# Patient Record
Sex: Female | Born: 1980 | Race: White | Hispanic: No | Marital: Married | Smoking: Never smoker
Health system: Southern US, Community
[De-identification: ages and names within clinical notes are randomized; demographics above are authoritative.]

## PROBLEM LIST (undated history)

## (undated) DIAGNOSIS — C50919 Malignant neoplasm of unspecified site of unspecified female breast: Secondary | ICD-10-CM

## (undated) DIAGNOSIS — Z9889 Other specified postprocedural states: Secondary | ICD-10-CM

## (undated) DIAGNOSIS — Z923 Personal history of irradiation: Secondary | ICD-10-CM

## (undated) DIAGNOSIS — F419 Anxiety disorder, unspecified: Secondary | ICD-10-CM

## (undated) HISTORY — PX: TONSILLECTOMY: SUR1361

## (undated) HISTORY — DX: Malignant neoplasm of unspecified site of unspecified female breast: C50.919

## (undated) HISTORY — PX: BREAST BIOPSY: SHX20

---

## 2000-02-04 ENCOUNTER — Other Ambulatory Visit: Admission: RE | Admit: 2000-02-04 | Discharge: 2000-02-04 | Payer: Self-pay | Admitting: Otolaryngology

## 2000-02-04 ENCOUNTER — Encounter (INDEPENDENT_AMBULATORY_CARE_PROVIDER_SITE_OTHER): Payer: Self-pay | Admitting: Specialist

## 2003-06-05 ENCOUNTER — Other Ambulatory Visit: Admission: RE | Admit: 2003-06-05 | Discharge: 2003-06-05 | Payer: Self-pay | Admitting: Obstetrics and Gynecology

## 2004-01-07 ENCOUNTER — Other Ambulatory Visit: Admission: RE | Admit: 2004-01-07 | Discharge: 2004-01-07 | Payer: Self-pay | Admitting: Obstetrics and Gynecology

## 2004-06-30 ENCOUNTER — Other Ambulatory Visit: Admission: RE | Admit: 2004-06-30 | Discharge: 2004-06-30 | Payer: Self-pay | Admitting: Obstetrics and Gynecology

## 2005-03-08 ENCOUNTER — Encounter (INDEPENDENT_AMBULATORY_CARE_PROVIDER_SITE_OTHER): Payer: Self-pay | Admitting: *Deleted

## 2005-03-08 ENCOUNTER — Inpatient Hospital Stay (HOSPITAL_COMMUNITY): Admission: EM | Admit: 2005-03-08 | Discharge: 2005-03-13 | Payer: Self-pay | Admitting: Obstetrics and Gynecology

## 2005-03-14 ENCOUNTER — Encounter: Admission: RE | Admit: 2005-03-14 | Discharge: 2005-04-13 | Payer: Self-pay | Admitting: Obstetrics and Gynecology

## 2005-04-14 ENCOUNTER — Encounter: Admission: RE | Admit: 2005-04-14 | Discharge: 2005-05-03 | Payer: Self-pay | Admitting: Obstetrics and Gynecology

## 2005-07-06 ENCOUNTER — Other Ambulatory Visit: Admission: RE | Admit: 2005-07-06 | Discharge: 2005-07-06 | Payer: Self-pay | Admitting: Obstetrics and Gynecology

## 2008-12-21 ENCOUNTER — Inpatient Hospital Stay (HOSPITAL_COMMUNITY): Admission: AD | Admit: 2008-12-21 | Discharge: 2008-12-21 | Payer: Self-pay | Admitting: Obstetrics & Gynecology

## 2008-12-22 ENCOUNTER — Inpatient Hospital Stay (HOSPITAL_COMMUNITY): Admission: AD | Admit: 2008-12-22 | Discharge: 2008-12-22 | Payer: Self-pay | Admitting: Obstetrics & Gynecology

## 2008-12-23 ENCOUNTER — Inpatient Hospital Stay (HOSPITAL_COMMUNITY): Admission: AD | Admit: 2008-12-23 | Discharge: 2008-12-25 | Payer: Self-pay | Admitting: Obstetrics and Gynecology

## 2010-11-01 LAB — CBC
Hemoglobin: 10.8 g/dL — ABNORMAL LOW (ref 12.0–15.0)
MCV: 94.2 fL (ref 78.0–100.0)
Platelets: 168 10*3/uL (ref 150–400)
Platelets: 226 10*3/uL (ref 150–400)
RBC: 4.09 MIL/uL (ref 3.87–5.11)
RDW: 13 % (ref 11.5–15.5)
WBC: 12.3 10*3/uL — ABNORMAL HIGH (ref 4.0–10.5)
WBC: 18.2 10*3/uL — ABNORMAL HIGH (ref 4.0–10.5)

## 2010-11-01 LAB — RPR: RPR Ser Ql: NONREACTIVE

## 2010-11-02 LAB — CBC
HCT: 38.1 % (ref 36.0–46.0)
Hemoglobin: 13.2 g/dL (ref 12.0–15.0)
MCHC: 34.7 g/dL (ref 30.0–36.0)
RBC: 4.03 MIL/uL (ref 3.87–5.11)

## 2010-12-10 NOTE — Discharge Summary (Signed)
NAME:  LIELA, RYLEE                    ACCOUNT NO.:  [id number]   MEDICAL RECORD NO.:  [id number]          PATIENT TYPE:  INP   LOCATION:  9125                          FACILITY:  [facility]   PHYSICIAN:  Gerrit Friends. Aldona Bar, M.D.   DATE OF BIRTH:  1980-08-27   DATE OF ADMISSION:  03/08/2005  DATE OF DISCHARGE:  03/13/2005                                 DISCHARGE SUMMARY   DISCHARGE DIAGNOSIS:  1.  Term pregnancy, delivered a viable female infant 6 pounds 14 ounces,      Apgars of 08/09, arterial cord pH of 7.24.  2.  Blood type O positive.  3.  Placental abruption.   PROCEDURES:  Primary low-transverse cesarean section.   SUMMARY:  This 29 year old primigravida with a due date of March 06, 2005,  presented to maternity admissions with the onset of sudden pain and  bleeding. When evaluated she was 2 cm dilated, 80% effaced with vertex at -2  station and was felt to have possibly a small abruption ongoing as she was  contracting very frequently with very little relief of discomfort in between  contractions. She was taken to the operating room for primary low transverse  cesarean section which was carried out by Dr. Tenny Craw and was delivered 6  pounds 14 ounces female infant with Apgars of 08/09 arterial cord pH 7.24.  There was a retroplacental clot noted. Her postoperative course was benign  although hemoglobin settled at 7.1 with a white count 14,600, platelet count  of 170,000. She remained afebrile during her hospital course. She was  pumping her breasts. Unfortunately, the baby at the time of the mother's  discharge remains in the newborn intensive care unit with problems with  keeping his glucose levels up.   As mentioned, mother's hospitalization postop was uncomplicated. She had  staples removed and wound was Steri-Stripped on the morning of discharge.  She was given all of instructions at the time of discharge and understood  all instructions well.   DISCHARGE MEDICATIONS:  1.  Motrin  600 mg every 6 hours as needed for pain.  2.  She will continue on her prenatal vitamins.  3.  She will add Feosol capsules one daily.   FOLLOW UP:  She will return the office follow-up in approximately four  weeks' time or as needed.   CONDITION ON DISCHARGE:  Improved.      Gerrit Friends. Aldona Bar, M.D.  Electronically Signed     RMW/MEDQ  D:  03/13/2005  T:  03/14/2005  Job:  [id number]

## 2010-12-10 NOTE — Op Note (Signed)
NAME:  Kathryn Mckenzie, Kathryn Mckenzie                    ACCOUNT NO.:  [id number]   MEDICAL RECORD NO.:  [id number]          PATIENT TYPE:  INP   LOCATION:  9125                          FACILITY:  [facility]   PHYSICIAN:  Miguel Aschoff, M.D.       DATE OF BIRTH:  15-May-1981   DATE OF PROCEDURE:  03/08/2005  DATE OF DISCHARGE:                                 OPERATIVE REPORT   PREOPERATIVE DIAGNOSES:  1.  Intrauterine pregnancy at term.  2.  Abruptio placentae.   POSTOPERATIVE DIAGNOSES:  1.  Intrauterine pregnancy at term.  2.  Abruptio placentae.  3.  Delivery of viable female infant, Apgar 8 and 9.   PROCEDURE:  Primary low flap transverse cesarean section.   SURGEON:  Miguel Aschoff, M.D.   ANESTHESIA:  Epidural.   COMPLICATIONS:  None.   JUSTIFICATION:  The patient is a 30 year old white female at term, who  presented to the triage unit with history of sudden onset of severe pain and  vaginal bleeding.  The patient was noted to be 2 cm, was put on the monitor.  The fetal heart pattern was reactive.  Clinical examination revealed her to  be 2 cm with what appeared to be a heavy bloody vaginal discharge.  Initially it was not apparent whether this represented bloody show or  possibly a small abruption, and the patient was admitted to the labor and  delivery unit for management.  While on labor and delivery, the patient  developed very frequent contractions at one to two-minute intervals.  The  uterus was poorly relaxing, bleeding increased, and the clinical diagnosis  of abruptio placentae was made, and the patient was taken for a cesarean  section.   PROCEDURE:  The patient was taken to the operating room, placed in supine  position deviated the left, and prepped and draped in the usual sterile  fashion.  A satisfactory level of epidural anesthesia was achieved without  difficulty.  A Foley catheter had been previously placed.  A Pfannenstiel  incision was then made, extended through subcutaneous tissue  with bleeding  points being clamped and coagulated as they were encountered.  The fascia  was then identified, incised transversely.  It was then separated from the  underlying rectus muscles.  The rectus muscles were divided in the midline.  The peritoneum was then found and entered, carefully underlying structures.  The bladder flap was then created and protected with a bladder blade.  Then  an elliptical transverse incision was made into the lower uterine segment.  The amniotic cavity was entered.  Port wine colored amniotic fluid was  obtained.  The patient  was then delivered of viable female infant, Apgars 8  at one minute and 9 at five minutes, with a pH of 7.24, from a vertex  presentation with the assistance of a vacuum extractor.  The baby was handed  to the pediatric team in attendance.  Cord pH was obtained and 7.24.  Remainder of the cord bloods were then obtained and sent for routine  laboratory studies.  The placenta was  then delivered.  On inspection of the  placenta, it was obvious that there was a retroplacental clot representing  between 20 and 25% of the placental surface.  The uterus was then evacuated  of any remaining products of conception, Pitocin was administered, and then  the uterus was closed.  The angles of the uterine incision were closed using  figure-of-eight sutures of #1 Vicryl, then the uterus was closed in layers,  the first layer was a running interlocking suture of #1 Vicryl, followed by  an imbricating suture of #1 Vicryl.  The bladder flap was reapproximated  using running continuous 2-0 Vicryl suture.  At this point, lap counts and  instrument counts were found to be correct, there was excellent hemostasis,  and this point the abdomen was closed.  The parietal peritoneum was closed  using running continuous 0 Vicryl suture.  Rectus muscles were  reapproximated using running continuous 0 Vicryl suture. The fascia was  closed using two sutures of 0  Vicryl each starting at the lateral fascial  angles and meeting in the midline.  Subcutaneous tissue was closed using interrupted 0 Vicryl sutures, and the  skin incision was closed using staples.  The estimated blood loss was  approximately 700 mL.  The patient tolerated the procedure well and went to  the recovery room in satisfactory condition.  The baby was taken the nursery  in satisfactory condition.      Miguel Aschoff, M.D.  Electronically Signed     AR/MEDQ  D:  03/08/2005  T:  03/09/2005  Job:  [id number]

## 2011-02-25 ENCOUNTER — Other Ambulatory Visit: Payer: Self-pay | Admitting: Obstetrics and Gynecology

## 2013-11-20 ENCOUNTER — Other Ambulatory Visit: Payer: Self-pay | Admitting: Obstetrics and Gynecology

## 2013-11-20 DIAGNOSIS — N6452 Nipple discharge: Secondary | ICD-10-CM

## 2013-11-20 DIAGNOSIS — N6320 Unspecified lump in the left breast, unspecified quadrant: Secondary | ICD-10-CM

## 2013-11-28 ENCOUNTER — Ambulatory Visit
Admission: RE | Admit: 2013-11-28 | Discharge: 2013-11-28 | Disposition: A | Discharge status: Home / Self Care | Payer: BC Managed Care – PPO | Source: Ambulatory Visit | Attending: Obstetrics and Gynecology | Admitting: Obstetrics and Gynecology

## 2013-11-28 ENCOUNTER — Other Ambulatory Visit: Payer: Self-pay

## 2013-11-28 DIAGNOSIS — N6452 Nipple discharge: Secondary | ICD-10-CM

## 2013-11-28 DIAGNOSIS — N6320 Unspecified lump in the left breast, unspecified quadrant: Secondary | ICD-10-CM

## 2014-12-18 ENCOUNTER — Other Ambulatory Visit: Payer: Self-pay | Admitting: Obstetrics and Gynecology

## 2014-12-19 LAB — CYTOLOGY - PAP

## 2015-12-29 ENCOUNTER — Other Ambulatory Visit: Payer: Self-pay | Admitting: Obstetrics and Gynecology

## 2015-12-30 LAB — CYTOLOGY - PAP

## 2016-08-01 DIAGNOSIS — D2239 Melanocytic nevi of other parts of face: Secondary | ICD-10-CM | POA: Diagnosis not present

## 2016-08-01 DIAGNOSIS — D220 Melanocytic nevi of lip: Secondary | ICD-10-CM | POA: Diagnosis not present

## 2016-08-01 DIAGNOSIS — D2230 Melanocytic nevi of unspecified part of face: Secondary | ICD-10-CM | POA: Diagnosis not present

## 2016-08-01 DIAGNOSIS — D485 Neoplasm of uncertain behavior of skin: Secondary | ICD-10-CM | POA: Diagnosis not present

## 2016-09-20 DIAGNOSIS — H10413 Chronic giant papillary conjunctivitis, bilateral: Secondary | ICD-10-CM | POA: Diagnosis not present

## 2016-12-28 DIAGNOSIS — M9901 Segmental and somatic dysfunction of cervical region: Secondary | ICD-10-CM | POA: Diagnosis not present

## 2016-12-28 DIAGNOSIS — M9903 Segmental and somatic dysfunction of lumbar region: Secondary | ICD-10-CM | POA: Diagnosis not present

## 2016-12-28 DIAGNOSIS — M5412 Radiculopathy, cervical region: Secondary | ICD-10-CM | POA: Diagnosis not present

## 2017-01-02 DIAGNOSIS — M9901 Segmental and somatic dysfunction of cervical region: Secondary | ICD-10-CM | POA: Diagnosis not present

## 2017-01-02 DIAGNOSIS — M9903 Segmental and somatic dysfunction of lumbar region: Secondary | ICD-10-CM | POA: Diagnosis not present

## 2017-01-02 DIAGNOSIS — M5412 Radiculopathy, cervical region: Secondary | ICD-10-CM | POA: Diagnosis not present

## 2017-01-04 DIAGNOSIS — M9903 Segmental and somatic dysfunction of lumbar region: Secondary | ICD-10-CM | POA: Diagnosis not present

## 2017-01-04 DIAGNOSIS — M9901 Segmental and somatic dysfunction of cervical region: Secondary | ICD-10-CM | POA: Diagnosis not present

## 2017-01-04 DIAGNOSIS — M5412 Radiculopathy, cervical region: Secondary | ICD-10-CM | POA: Diagnosis not present

## 2017-04-11 DIAGNOSIS — D2262 Melanocytic nevi of left upper limb, including shoulder: Secondary | ICD-10-CM | POA: Diagnosis not present

## 2017-04-11 DIAGNOSIS — L718 Other rosacea: Secondary | ICD-10-CM | POA: Diagnosis not present

## 2017-04-11 DIAGNOSIS — D2271 Melanocytic nevi of right lower limb, including hip: Secondary | ICD-10-CM | POA: Diagnosis not present

## 2017-04-11 DIAGNOSIS — D485 Neoplasm of uncertain behavior of skin: Secondary | ICD-10-CM | POA: Diagnosis not present

## 2017-04-11 DIAGNOSIS — D225 Melanocytic nevi of trunk: Secondary | ICD-10-CM | POA: Diagnosis not present

## 2017-04-14 DIAGNOSIS — J329 Chronic sinusitis, unspecified: Secondary | ICD-10-CM | POA: Diagnosis not present

## 2017-06-01 DIAGNOSIS — Z01419 Encounter for gynecological examination (general) (routine) without abnormal findings: Secondary | ICD-10-CM | POA: Diagnosis not present

## 2017-06-01 DIAGNOSIS — Z124 Encounter for screening for malignant neoplasm of cervix: Secondary | ICD-10-CM | POA: Diagnosis not present

## 2017-06-02 DIAGNOSIS — Z23 Encounter for immunization: Secondary | ICD-10-CM | POA: Diagnosis not present

## 2017-06-19 DIAGNOSIS — Z0000 Encounter for general adult medical examination without abnormal findings: Secondary | ICD-10-CM | POA: Diagnosis not present

## 2017-06-23 DIAGNOSIS — R7989 Other specified abnormal findings of blood chemistry: Secondary | ICD-10-CM | POA: Diagnosis not present

## 2017-06-23 DIAGNOSIS — E039 Hypothyroidism, unspecified: Secondary | ICD-10-CM | POA: Diagnosis not present

## 2017-06-23 DIAGNOSIS — Z0000 Encounter for general adult medical examination without abnormal findings: Secondary | ICD-10-CM | POA: Diagnosis not present

## 2017-08-29 DIAGNOSIS — E039 Hypothyroidism, unspecified: Secondary | ICD-10-CM | POA: Diagnosis not present

## 2017-08-29 DIAGNOSIS — R7989 Other specified abnormal findings of blood chemistry: Secondary | ICD-10-CM | POA: Diagnosis not present

## 2017-09-04 DIAGNOSIS — E559 Vitamin D deficiency, unspecified: Secondary | ICD-10-CM | POA: Diagnosis not present

## 2017-10-04 DIAGNOSIS — H10413 Chronic giant papillary conjunctivitis, bilateral: Secondary | ICD-10-CM | POA: Diagnosis not present

## 2018-05-22 DIAGNOSIS — D2261 Melanocytic nevi of right upper limb, including shoulder: Secondary | ICD-10-CM | POA: Diagnosis not present

## 2018-05-22 DIAGNOSIS — L905 Scar conditions and fibrosis of skin: Secondary | ICD-10-CM | POA: Diagnosis not present

## 2018-05-22 DIAGNOSIS — L7211 Pilar cyst: Secondary | ICD-10-CM | POA: Diagnosis not present

## 2018-06-26 DIAGNOSIS — E039 Hypothyroidism, unspecified: Secondary | ICD-10-CM | POA: Diagnosis not present

## 2018-06-26 DIAGNOSIS — R7989 Other specified abnormal findings of blood chemistry: Secondary | ICD-10-CM | POA: Diagnosis not present

## 2018-06-26 DIAGNOSIS — Z0000 Encounter for general adult medical examination without abnormal findings: Secondary | ICD-10-CM | POA: Diagnosis not present

## 2018-07-05 DIAGNOSIS — Z0000 Encounter for general adult medical examination without abnormal findings: Secondary | ICD-10-CM | POA: Diagnosis not present

## 2018-07-05 DIAGNOSIS — Z23 Encounter for immunization: Secondary | ICD-10-CM | POA: Diagnosis not present

## 2018-08-02 DIAGNOSIS — Z124 Encounter for screening for malignant neoplasm of cervix: Secondary | ICD-10-CM | POA: Diagnosis not present

## 2018-08-08 DIAGNOSIS — H6501 Acute serous otitis media, right ear: Secondary | ICD-10-CM | POA: Diagnosis not present

## 2018-08-08 DIAGNOSIS — R05 Cough: Secondary | ICD-10-CM | POA: Diagnosis not present

## 2019-08-13 ENCOUNTER — Other Ambulatory Visit: Payer: Self-pay | Admitting: Obstetrics and Gynecology

## 2019-08-13 DIAGNOSIS — N6320 Unspecified lump in the left breast, unspecified quadrant: Secondary | ICD-10-CM

## 2019-08-26 ENCOUNTER — Other Ambulatory Visit: Payer: Self-pay

## 2019-08-27 ENCOUNTER — Ambulatory Visit
Admission: RE | Admit: 2019-08-27 | Discharge: 2019-08-27 | Disposition: A | Discharge status: Home / Self Care | Payer: Self-pay | Source: Ambulatory Visit | Attending: Obstetrics and Gynecology | Admitting: Obstetrics and Gynecology

## 2019-08-27 ENCOUNTER — Other Ambulatory Visit: Payer: Self-pay

## 2019-08-27 ENCOUNTER — Other Ambulatory Visit: Payer: Self-pay | Admitting: Obstetrics and Gynecology

## 2019-08-27 DIAGNOSIS — N6320 Unspecified lump in the left breast, unspecified quadrant: Secondary | ICD-10-CM

## 2019-09-04 ENCOUNTER — Other Ambulatory Visit: Payer: Self-pay

## 2019-09-23 ENCOUNTER — Ambulatory Visit: Payer: Self-pay | Attending: Internal Medicine

## 2019-09-23 DIAGNOSIS — Z20822 Contact with and (suspected) exposure to covid-19: Secondary | ICD-10-CM

## 2019-09-24 LAB — NOVEL CORONAVIRUS, NAA: SARS-CoV-2, NAA: NOT DETECTED

## 2020-02-25 ENCOUNTER — Other Ambulatory Visit: Payer: Self-pay

## 2020-03-11 ENCOUNTER — Other Ambulatory Visit: Payer: Self-pay | Admitting: Obstetrics and Gynecology

## 2020-03-11 ENCOUNTER — Ambulatory Visit
Admission: RE | Admit: 2020-03-11 | Discharge: 2020-03-11 | Disposition: A | Discharge status: Home / Self Care | Payer: 59 | Source: Ambulatory Visit | Attending: Obstetrics and Gynecology | Admitting: Obstetrics and Gynecology

## 2020-03-11 ENCOUNTER — Other Ambulatory Visit: Payer: Self-pay

## 2020-03-11 DIAGNOSIS — N6320 Unspecified lump in the left breast, unspecified quadrant: Secondary | ICD-10-CM

## 2020-06-24 DIAGNOSIS — F5101 Primary insomnia: Secondary | ICD-10-CM | POA: Diagnosis not present

## 2020-07-08 DIAGNOSIS — E559 Vitamin D deficiency, unspecified: Secondary | ICD-10-CM | POA: Diagnosis not present

## 2020-07-08 DIAGNOSIS — E039 Hypothyroidism, unspecified: Secondary | ICD-10-CM | POA: Diagnosis not present

## 2020-07-08 DIAGNOSIS — R7989 Other specified abnormal findings of blood chemistry: Secondary | ICD-10-CM | POA: Diagnosis not present

## 2020-07-08 DIAGNOSIS — Z13220 Encounter for screening for lipoid disorders: Secondary | ICD-10-CM | POA: Diagnosis not present

## 2020-07-08 DIAGNOSIS — Z0000 Encounter for general adult medical examination without abnormal findings: Secondary | ICD-10-CM | POA: Diagnosis not present

## 2020-07-13 DIAGNOSIS — Z0000 Encounter for general adult medical examination without abnormal findings: Secondary | ICD-10-CM | POA: Diagnosis not present

## 2020-07-13 DIAGNOSIS — R7989 Other specified abnormal findings of blood chemistry: Secondary | ICD-10-CM | POA: Diagnosis not present

## 2020-08-26 DIAGNOSIS — D2261 Melanocytic nevi of right upper limb, including shoulder: Secondary | ICD-10-CM | POA: Diagnosis not present

## 2020-08-26 DIAGNOSIS — L821 Other seborrheic keratosis: Secondary | ICD-10-CM | POA: Diagnosis not present

## 2020-08-26 DIAGNOSIS — L7211 Pilar cyst: Secondary | ICD-10-CM | POA: Diagnosis not present

## 2020-08-26 DIAGNOSIS — D2262 Melanocytic nevi of left upper limb, including shoulder: Secondary | ICD-10-CM | POA: Diagnosis not present

## 2020-08-28 DIAGNOSIS — Z6820 Body mass index (BMI) 20.0-20.9, adult: Secondary | ICD-10-CM | POA: Diagnosis not present

## 2020-08-28 DIAGNOSIS — Z01419 Encounter for gynecological examination (general) (routine) without abnormal findings: Secondary | ICD-10-CM | POA: Diagnosis not present

## 2020-09-14 ENCOUNTER — Other Ambulatory Visit: Payer: Self-pay

## 2020-09-14 ENCOUNTER — Ambulatory Visit
Admission: RE | Admit: 2020-09-14 | Discharge: 2020-09-14 | Disposition: A | Discharge status: Home / Self Care | Payer: 59 | Source: Ambulatory Visit | Attending: Obstetrics and Gynecology | Admitting: Obstetrics and Gynecology

## 2020-09-14 DIAGNOSIS — N6320 Unspecified lump in the left breast, unspecified quadrant: Secondary | ICD-10-CM

## 2020-09-14 DIAGNOSIS — N6002 Solitary cyst of left breast: Secondary | ICD-10-CM | POA: Diagnosis not present

## 2020-10-14 DIAGNOSIS — Z20822 Contact with and (suspected) exposure to covid-19: Secondary | ICD-10-CM | POA: Diagnosis not present

## 2020-10-16 DIAGNOSIS — M9901 Segmental and somatic dysfunction of cervical region: Secondary | ICD-10-CM | POA: Diagnosis not present

## 2020-10-16 DIAGNOSIS — M9903 Segmental and somatic dysfunction of lumbar region: Secondary | ICD-10-CM | POA: Diagnosis not present

## 2020-10-16 DIAGNOSIS — M9905 Segmental and somatic dysfunction of pelvic region: Secondary | ICD-10-CM | POA: Diagnosis not present

## 2020-10-16 DIAGNOSIS — M9902 Segmental and somatic dysfunction of thoracic region: Secondary | ICD-10-CM | POA: Diagnosis not present

## 2020-10-22 DIAGNOSIS — M9903 Segmental and somatic dysfunction of lumbar region: Secondary | ICD-10-CM | POA: Diagnosis not present

## 2020-10-22 DIAGNOSIS — M9902 Segmental and somatic dysfunction of thoracic region: Secondary | ICD-10-CM | POA: Diagnosis not present

## 2020-10-22 DIAGNOSIS — M9901 Segmental and somatic dysfunction of cervical region: Secondary | ICD-10-CM | POA: Diagnosis not present

## 2020-10-22 DIAGNOSIS — M9905 Segmental and somatic dysfunction of pelvic region: Secondary | ICD-10-CM | POA: Diagnosis not present

## 2020-10-30 DIAGNOSIS — M9905 Segmental and somatic dysfunction of pelvic region: Secondary | ICD-10-CM | POA: Diagnosis not present

## 2020-10-30 DIAGNOSIS — M9903 Segmental and somatic dysfunction of lumbar region: Secondary | ICD-10-CM | POA: Diagnosis not present

## 2020-10-30 DIAGNOSIS — M9902 Segmental and somatic dysfunction of thoracic region: Secondary | ICD-10-CM | POA: Diagnosis not present

## 2020-10-30 DIAGNOSIS — M9901 Segmental and somatic dysfunction of cervical region: Secondary | ICD-10-CM | POA: Diagnosis not present

## 2020-11-16 DIAGNOSIS — M9903 Segmental and somatic dysfunction of lumbar region: Secondary | ICD-10-CM | POA: Diagnosis not present

## 2020-11-16 DIAGNOSIS — M9905 Segmental and somatic dysfunction of pelvic region: Secondary | ICD-10-CM | POA: Diagnosis not present

## 2020-11-16 DIAGNOSIS — M9902 Segmental and somatic dysfunction of thoracic region: Secondary | ICD-10-CM | POA: Diagnosis not present

## 2020-11-16 DIAGNOSIS — M9901 Segmental and somatic dysfunction of cervical region: Secondary | ICD-10-CM | POA: Diagnosis not present

## 2020-11-19 DIAGNOSIS — H52203 Unspecified astigmatism, bilateral: Secondary | ICD-10-CM | POA: Diagnosis not present

## 2020-11-19 DIAGNOSIS — H10413 Chronic giant papillary conjunctivitis, bilateral: Secondary | ICD-10-CM | POA: Diagnosis not present

## 2020-11-19 DIAGNOSIS — H5213 Myopia, bilateral: Secondary | ICD-10-CM | POA: Diagnosis not present

## 2020-11-23 DIAGNOSIS — M9902 Segmental and somatic dysfunction of thoracic region: Secondary | ICD-10-CM | POA: Diagnosis not present

## 2020-11-23 DIAGNOSIS — M9905 Segmental and somatic dysfunction of pelvic region: Secondary | ICD-10-CM | POA: Diagnosis not present

## 2020-11-23 DIAGNOSIS — M9901 Segmental and somatic dysfunction of cervical region: Secondary | ICD-10-CM | POA: Diagnosis not present

## 2020-11-23 DIAGNOSIS — M9903 Segmental and somatic dysfunction of lumbar region: Secondary | ICD-10-CM | POA: Diagnosis not present

## 2020-12-07 DIAGNOSIS — M9902 Segmental and somatic dysfunction of thoracic region: Secondary | ICD-10-CM | POA: Diagnosis not present

## 2020-12-07 DIAGNOSIS — M9905 Segmental and somatic dysfunction of pelvic region: Secondary | ICD-10-CM | POA: Diagnosis not present

## 2020-12-07 DIAGNOSIS — M9901 Segmental and somatic dysfunction of cervical region: Secondary | ICD-10-CM | POA: Diagnosis not present

## 2020-12-07 DIAGNOSIS — M9903 Segmental and somatic dysfunction of lumbar region: Secondary | ICD-10-CM | POA: Diagnosis not present

## 2020-12-31 DIAGNOSIS — M9901 Segmental and somatic dysfunction of cervical region: Secondary | ICD-10-CM | POA: Diagnosis not present

## 2020-12-31 DIAGNOSIS — M9902 Segmental and somatic dysfunction of thoracic region: Secondary | ICD-10-CM | POA: Diagnosis not present

## 2020-12-31 DIAGNOSIS — M9903 Segmental and somatic dysfunction of lumbar region: Secondary | ICD-10-CM | POA: Diagnosis not present

## 2020-12-31 DIAGNOSIS — M9905 Segmental and somatic dysfunction of pelvic region: Secondary | ICD-10-CM | POA: Diagnosis not present

## 2021-01-28 DIAGNOSIS — M9903 Segmental and somatic dysfunction of lumbar region: Secondary | ICD-10-CM | POA: Diagnosis not present

## 2021-01-28 DIAGNOSIS — M9905 Segmental and somatic dysfunction of pelvic region: Secondary | ICD-10-CM | POA: Diagnosis not present

## 2021-01-28 DIAGNOSIS — M9902 Segmental and somatic dysfunction of thoracic region: Secondary | ICD-10-CM | POA: Diagnosis not present

## 2021-01-28 DIAGNOSIS — M9901 Segmental and somatic dysfunction of cervical region: Secondary | ICD-10-CM | POA: Diagnosis not present

## 2021-03-01 DIAGNOSIS — M9901 Segmental and somatic dysfunction of cervical region: Secondary | ICD-10-CM | POA: Diagnosis not present

## 2021-03-01 DIAGNOSIS — M9905 Segmental and somatic dysfunction of pelvic region: Secondary | ICD-10-CM | POA: Diagnosis not present

## 2021-03-01 DIAGNOSIS — M9902 Segmental and somatic dysfunction of thoracic region: Secondary | ICD-10-CM | POA: Diagnosis not present

## 2021-03-01 DIAGNOSIS — M9903 Segmental and somatic dysfunction of lumbar region: Secondary | ICD-10-CM | POA: Diagnosis not present

## 2021-04-15 DIAGNOSIS — M9905 Segmental and somatic dysfunction of pelvic region: Secondary | ICD-10-CM | POA: Diagnosis not present

## 2021-04-15 DIAGNOSIS — M9903 Segmental and somatic dysfunction of lumbar region: Secondary | ICD-10-CM | POA: Diagnosis not present

## 2021-04-15 DIAGNOSIS — M9901 Segmental and somatic dysfunction of cervical region: Secondary | ICD-10-CM | POA: Diagnosis not present

## 2021-04-15 DIAGNOSIS — M9902 Segmental and somatic dysfunction of thoracic region: Secondary | ICD-10-CM | POA: Diagnosis not present

## 2021-07-25 HISTORY — PX: BREAST LUMPECTOMY: SHX2

## 2021-08-11 ENCOUNTER — Other Ambulatory Visit: Payer: Self-pay | Admitting: Registered Nurse

## 2021-08-11 DIAGNOSIS — N6320 Unspecified lump in the left breast, unspecified quadrant: Secondary | ICD-10-CM

## 2021-08-11 DIAGNOSIS — N6310 Unspecified lump in the right breast, unspecified quadrant: Secondary | ICD-10-CM

## 2021-08-30 ENCOUNTER — Ambulatory Visit
Admission: RE | Admit: 2021-08-30 | Discharge: 2021-08-30 | Disposition: A | Discharge status: Home / Self Care | Payer: BC Managed Care – PPO | Source: Ambulatory Visit | Attending: Registered Nurse | Admitting: Registered Nurse

## 2021-08-30 ENCOUNTER — Other Ambulatory Visit: Payer: Self-pay | Admitting: Registered Nurse

## 2021-08-30 ENCOUNTER — Ambulatory Visit
Admission: RE | Admit: 2021-08-30 | Discharge: 2021-08-30 | Disposition: A | Discharge status: Home / Self Care | Payer: No Typology Code available for payment source | Source: Ambulatory Visit | Attending: Registered Nurse | Admitting: Registered Nurse

## 2021-08-30 ENCOUNTER — Other Ambulatory Visit: Payer: Self-pay

## 2021-08-30 DIAGNOSIS — N6310 Unspecified lump in the right breast, unspecified quadrant: Secondary | ICD-10-CM

## 2021-08-30 DIAGNOSIS — N6320 Unspecified lump in the left breast, unspecified quadrant: Secondary | ICD-10-CM

## 2021-09-02 ENCOUNTER — Ambulatory Visit
Admission: RE | Admit: 2021-09-02 | Discharge: 2021-09-02 | Disposition: A | Discharge status: Home / Self Care | Payer: No Typology Code available for payment source | Source: Ambulatory Visit | Attending: Registered Nurse | Admitting: Registered Nurse

## 2021-09-02 DIAGNOSIS — N6320 Unspecified lump in the left breast, unspecified quadrant: Secondary | ICD-10-CM

## 2021-09-02 DIAGNOSIS — N6310 Unspecified lump in the right breast, unspecified quadrant: Secondary | ICD-10-CM

## 2021-09-03 ENCOUNTER — Telehealth: Payer: Self-pay | Admitting: Hematology and Oncology

## 2021-09-03 NOTE — Telephone Encounter (Signed)
Spoke to patient to confirm afternoon clinic appointment for 2/15, paperwork will be sent via e-mail

## 2021-09-06 ENCOUNTER — Encounter: Payer: Self-pay | Admitting: *Deleted

## 2021-09-07 ENCOUNTER — Other Ambulatory Visit: Payer: Self-pay | Admitting: *Deleted

## 2021-09-07 DIAGNOSIS — C50411 Malignant neoplasm of upper-outer quadrant of right female breast: Secondary | ICD-10-CM | POA: Insufficient documentation

## 2021-09-07 DIAGNOSIS — Z170 Estrogen receptor positive status [ER+]: Secondary | ICD-10-CM | POA: Insufficient documentation

## 2021-09-07 NOTE — Progress Notes (Signed)
[facility] CONSULT NOTE  Patient Care Team: Holland Commons, FNP as PCP - General (Internal Medicine) Jovita Kussmaul, MD as Consulting Physician (General Surgery) Nicholas Lose, MD as Consulting Physician (Hematology and Oncology) Eppie Gibson, MD as Attending Physician (Radiation Oncology)  CHIEF COMPLAINTS/PURPOSE OF CONSULTATION:  Newly diagnosed right breast cancer  HISTORY OF PRESENTING ILLNESS:  Kathryn Mckenzie 41 y.o. 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 She presented with a palpable mass in the right breast. Diagnostic mammogram and [place] on 08/30/2021 showed suspicious right breast mass at the 10 o'clock position 8 cm from the nipple. Biopsy on 09/02/2021 showed invasive ductal carcinoma and DCIS, ER/PR+/Her2-. She presents to the clinic today for initial evaluation and discussion of treatment options.   I reviewed her records extensively and collaborated the history with the patient.  SUMMARY OF ONCOLOGIC HISTORY: Oncology History  Malignant neoplasm of upper-outer quadrant of right breast in female, estrogen receptor positive ([place])  09/02/2021 Initial Diagnosis   Palpable right breast lump for 2 to 3 months UOQ.  Mammogram and ultrasound: 10:00: 1.2 cm mass, axilla negative, biopsy: Grade 1 IDC with DCIS (inside a fibroadenoma) ER 90%, PR 100%, HER2 negative, Ki-67 1%     MEDICAL HISTORY:  Past Medical History:  Diagnosis Date   Breast cancer ([place])     SURGICAL HISTORY: Past Surgical History:  Procedure Laterality Date   CESAREAN SECTION     TONSILLECTOMY      SOCIAL HISTORY: Social History   Socioeconomic History   Marital status: Married    Spouse name: Not on file   Number of children: Not on file   Years of education: Not on file   Highest education level: Not on file  Occupational History   Not on file  Tobacco Use   Smoking status: Never   Smokeless tobacco: Never   Substance and Sexual Activity   Alcohol use: Yes   Drug use: Never   Sexual activity: Not on file  Other Topics Concern   Not on file  Social History Narrative   Not on file   Social Determinants of Health   Financial Resource Strain: Not on file  Food Insecurity: Not on file  Transportation Needs: Not on file  Physical Activity: Not on file  Stress: Not on file  Social Connections: Not on file  Intimate Partner Violence: Not on file    FAMILY HISTORY: Family History  Problem Relation Age of Onset   Breast cancer Maternal Grandmother 80   Pancreatic cancer Paternal Grandmother     ALLERGIES:  has no allergies on file.  MEDICATIONS:  No current outpatient medications on file.   No current facility-administered medications for this visit.    REVIEW OF SYSTEMS:   Constitutional: Denies fevers, chills or abnormal night sweats  All other systems were reviewed with the patient and are negative.  PHYSICAL EXAMINATION: ECOG PERFORMANCE STATUS: 1 - Symptomatic but completely ambulatory  Vitals:   09/08/21 1241  BP: 123/73  Pulse: 71  Resp: 18  Temp: 98.1 F (36.7 C)  SpO2: 100%   Filed Weights   09/08/21 1241  Weight: 125 lb 1.6 oz (56.7 kg)      LABORATORY DATA:  I have reviewed the data as listed Lab Results  Component Value Date   WBC 5.9 09/08/2021   HGB 12.9 09/08/2021   HCT 38.3 09/08/2021   MCV 95.8 09/08/2021   PLT  239 09/08/2021   Lab Results  Component Value Date   NA 139 09/08/2021   K 4.6 09/08/2021   CL 105 09/08/2021   CO2 28 09/08/2021    RADIOGRAPHIC STUDIES: I have personally reviewed the radiological reports and agreed with the findings in the report.  ASSESSMENT AND PLAN:  Malignant neoplasm of upper-outer quadrant of right breast in female, estrogen receptor positive ([place]) 09/02/2021:Palpable right breast lump for 2 to 3 months UOQ.  Mammogram and ultrasound: 10:00: 1.2 cm mass, axilla negative, biopsy: Grade 1 IDC with DCIS  (inside a fibroadenoma) ER 90%, PR 100%, HER2 negative, Ki-67 1%  Pathology and radiology counseling:Discussed with the patient, the details of pathology including the type of breast cancer,the clinical staging, the significance of ER, PR and HER-2/neu receptors and the implications for treatment. After reviewing the pathology in detail, we proceeded to discuss the different treatment options between surgery, radiation, chemotherapy, antiestrogen therapies.  Recommendations: Breast MRI to be done because of high breast density 1. Breast conserving surgery followed by 2. Oncotype DX testing to determine if chemotherapy would be of any benefit followed by 3. Adjuvant radiation therapy followed by 4. Adjuvant antiestrogen therapy 5.  Genetic counseling  Oncotype counseling: I discussed Oncotype DX test. I explained to the patient that this is a 21 gene panel to evaluate patient tumors DNA to calculate recurrence score. This would help determine whether patient has high risk or low risk breast cancer. She understands that if her tumor was found to be high risk, she would benefit from systemic chemotherapy. If low risk, no need of chemotherapy.  She was agreeable to participate in the exact sciences blood draw study Return to clinic after surgery to discuss final pathology report and then determine if Oncotype DX testing will need to be sent.    All questions were answered. The patient knows to call the clinic with any problems, questions or concerns.   Rulon Eisenmenger, MD, MPH 09/08/2021    I, Thana Ates, am acting as scribe for Nicholas Lose, MD.  I have reviewed the above documentation for accuracy and completeness, and I agree with the above.

## 2021-09-07 NOTE — Progress Notes (Signed)
Radiation Oncology         [phone] ________________________________  Initial Outpatient Consultation  Name: Kathryn Mckenzie MRN: [id number]  Date: 09/08/2021  DOB: 24-Nov-1980  VE:HMCNOBS, Leonia Reader, FNP  Jovita Kussmaul, MD   REFERRING PHYSICIAN: Autumn Messing III, MD  DIAGNOSIS:    ICD-10-CM   1. 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 female who presented with a 2-3 month history of palpable right and left breast lumps. She has always had dense tissue that is textured in her breasts, but a right UOQ lump felt new. There were shooting pains in this area, too.  Subsequently, the Kathryn Mckenzie underwent a bilateral diagnostic mammogram and ultrasound on the date of 08/30/21 which revealed a auspicious right breast mass at the 10 o'clock position, 8 cmfn. No suspicious right axillary lymphadenopathy or evidence of malignancy in the left breast were appreciated.   Right breast biopsy at the 10 o'clock position on the date of 09/02/21 showed grade 1 invasive ductal carcinoma measuring 1.0 cm in the greatest linear extent, and ductal carcinoma in-situ.  ER status 90% positive with moderate staining intensity; PR status 100% positive with strong staining intensity; Proliferation marker Ki67 at 1%; Her2 status negative. No lymph nodes were examined.    MRI of breasts is pending. Genetic counseling is pending.  She is here with her spouse and father. She is an Futures trader.  PREVIOUS RADIATION THERAPY: No  PAST MEDICAL HISTORY:  has a past medical history of Breast cancer ([place]).    PAST SURGICAL HISTORY: Past Surgical History:  Procedure Laterality Date   CESAREAN SECTION     TONSILLECTOMY      FAMILY HISTORY: family history includes Breast cancer (age of onset: 74) in her maternal grandmother; Melanoma in her maternal aunt and maternal uncle; Pancreatic cancer (age of onset: 83) in her paternal grandmother.  SOCIAL HISTORY:  reports that she has never smoked. She has never used smokeless tobacco. She reports current alcohol use. She reports that she does not use drugs.  ALLERGIES: Kathryn Mckenzie has no known allergies.  MEDICATIONS:  Current Outpatient Medications  Medication Sig Dispense Refill   sertraline (ZOLOFT) 25 MG tablet Take by mouth.     No current facility-administered medications for this encounter.    REVIEW OF SYSTEMS: As above in HPI.   PHYSICAL EXAM:  vitals were not taken for this visit.   General: Alert and oriented, in no acute distress HEENT: Head is normocephalic. Extraocular movements are intact.  Heart: Regular in rate and rhythm with no murmurs, rubs, or gallops. Chest: Clear to auscultation bilaterally, with no rhonchi, wheezes, or rales. Abdomen: Soft, nontender, nondistended, with no rigidity or guarding. Extremities: No cyanosis or edema. Lymphatics: see Neck Exam Skin: No concerning lesions. Musculoskeletal: symmetric strength and muscle tone throughout. Neurologic: Cranial nerves II through  XII are grossly intact. No obvious focalities. Speech is fluent. Coordination is intact. Psychiatric: Judgment and insight are intact. Affect is appropriate. Breasts: dense, heterogeneous breast tissue b/l. Right breast UOQ mass at bx site is about 2cm . No obvious axillary adenopathy b/l   ECOG =  0  0 - Asymptomatic (Fully active, able to carry on all predisease activities without restriction)  1 - Symptomatic but completely ambulatory (Restricted in physically strenuous activity but ambulatory and able to carry out work of a light or sedentary nature. For example, light housework, office work)  2 - Symptomatic, <50% in bed during the day (Ambulatory and capable of all self care but unable to carry out any work activities. Up and about more than 50% of waking hours)  3 - Symptomatic, >50% in bed, but not bedbound (Capable of only limited self-care, confined to bed or chair 50% or more of waking hours)  4 - Bedbound (Completely disabled. Cannot carry on any self-care. Totally confined to bed or chair)  5 - Death   Eustace Pen MM, Creech RH, Tormey DC, et al. [phone]). "Toxicity and response criteria of the [facility]". [address] Oncol. 5 (6): 649-55   LABORATORY DATA:  Lab Results  Component Value Date   WBC 5.9 09/08/2021   HGB 12.9 09/08/2021   HCT 38.3 09/08/2021   MCV 95.8 09/08/2021   PLT 239 09/08/2021   CMP     Component Value Date/Time   NA 139 09/08/2021 1225   K 4.6 09/08/2021 1225   CL 105 09/08/2021 1225   CO2 28 09/08/2021 1225   GLUCOSE 99 09/08/2021 1225   BUN 15 09/08/2021 1225   CREATININE 0.97 09/08/2021 1225   CALCIUM 9.6 09/08/2021 1225   PROT 7.0 09/08/2021 1225   ALBUMIN 4.5 09/08/2021 1225   AST 18 09/08/2021 1225   ALT 15 09/08/2021 1225   ALKPHOS 39 09/08/2021 1225   BILITOT 0.4 09/08/2021 1225   GFRNONAA >60 09/08/2021 1225         RADIOGRAPHY: US BREAST LTD UNI LEFT INC AXILLA  Result Date: 08/30/2021 CLINICAL DATA:  41 year old female with a palpable right breast lump for 2-3 months in a physician palpated left breast lump. EXAM: DIGITAL DIAGNOSTIC BILATERAL MAMMOGRAM WITH TOMOSYNTHESIS AND CAD; ULTRASOUND LEFT BREAST LIMITED; ULTRASOUND RIGHT BREAST LIMITED TECHNIQUE: Bilateral digital diagnostic mammography and  breast tomosynthesis was performed. The images were evaluated with computer-aided detection.; Targeted ultrasound examination of the left breast was performed.; Targeted ultrasound examination of the right breast was performed COMPARISON:  Previous exam(s). ACR Breast Density Category d: The breast tissue is extremely dense, which lowers the sensitivity of mammography. FINDINGS: A radiopaque BB was placed at the site of the Kathryn Mckenzie's palpable right breast lump in the upper outer quadrant posteriorly. Focal distortion is seen just deep to the radiopaque BB. An additional BB was placed in the upper-outer quadrant of the left breast, with no focal or underlying mammographic findings. No additional suspicious mammographic findings in the remainder of either breast. Targeted ultrasound is performed, showing an irregular, hypoechoic mass with shadowing and associated vascularity at the 10 o'clock position 8 cm from the nipple on the right. It measures 1.1 x 1.2 x 0.8 cm. This corresponds with the Kathryn Mckenzie's palpable lump in focal distortion. Evaluation of the right axilla demonstrates no suspicious lymphadenopathy. Ultrasound evaluation of the upper outer left breast demonstrates no focal findings at the site of the Kathryn Mckenzie's palpable lump. A circumscribed, anechoic cyst measuring up  to 1.3 cm is incidentally noted at the 1 o'clock position 5 cm from the nipple. IMPRESSION: 1. Suspicious right breast mass at the 10 o'clock position 8 cm from the nipple. Recommendation is for ultrasound-guided biopsy. 2. No suspicious right axillary lymphadenopathy. 3. No mammographic or sonographic evidence of malignancy on the left. RECOMMENDATION: 1. Ultrasound-guided biopsy of the right breast. 2. Pending biopsy results, further evaluation with contrast enhanced breast MRI is recommended given the Kathryn Mckenzie's extreme breast density. I have discussed the findings and recommendations with the Kathryn Mckenzie. If applicable, a reminder letter will  be sent to the Kathryn Mckenzie regarding the next appointment. BI-RADS CATEGORY  4: Suspicious. Electronically Signed   By: Kristopher Oppenheim M.D.   On: 08/30/2021 10:46  US BREAST LTD UNI RIGHT INC AXILLA  Result Date: 08/30/2021 CLINICAL DATA:  41 year old female with a palpable right breast lump for 2-3 months in a physician palpated left breast lump. EXAM: DIGITAL DIAGNOSTIC BILATERAL MAMMOGRAM WITH TOMOSYNTHESIS AND CAD; ULTRASOUND LEFT BREAST LIMITED; ULTRASOUND RIGHT BREAST LIMITED TECHNIQUE: Bilateral digital diagnostic mammography and breast tomosynthesis was performed. The images were evaluated with computer-aided detection.; Targeted ultrasound examination of the left breast was performed.; Targeted ultrasound examination of the right breast was performed COMPARISON:  Previous exam(s). ACR Breast Density Category d: The breast tissue is extremely dense, which lowers the sensitivity of mammography. FINDINGS: A radiopaque BB was placed at the site of the Kathryn Mckenzie's palpable right breast lump in the upper outer quadrant posteriorly. Focal distortion is seen just deep to the radiopaque BB. An additional BB was placed in the upper-outer quadrant of the left breast, with no focal or underlying mammographic findings. No additional suspicious mammographic findings in the remainder of either breast. Targeted ultrasound is performed, showing an irregular, hypoechoic mass with shadowing and associated vascularity at the 10 o'clock position 8 cm from the nipple on the right. It measures 1.1 x 1.2 x 0.8 cm. This corresponds with the Kathryn Mckenzie's palpable lump in focal distortion. Evaluation of the right axilla demonstrates no suspicious lymphadenopathy. Ultrasound evaluation of the upper outer left breast demonstrates no focal findings at the site of the Kathryn Mckenzie's palpable lump. A circumscribed, anechoic cyst measuring up to 1.3 cm is incidentally noted at the 1 o'clock position 5 cm from the nipple. IMPRESSION: 1. Suspicious  right breast mass at the 10 o'clock position 8 cm from the nipple. Recommendation is for ultrasound-guided biopsy. 2. No suspicious right axillary lymphadenopathy. 3. No mammographic or sonographic evidence of malignancy on the left. RECOMMENDATION: 1. Ultrasound-guided biopsy of the right breast. 2. Pending biopsy results, further evaluation with contrast enhanced breast MRI is recommended given the Kathryn Mckenzie's extreme breast density. I have discussed the findings and recommendations with the Kathryn Mckenzie. If applicable, a reminder letter will be sent to the Kathryn Mckenzie regarding the next appointment. BI-RADS CATEGORY  4: Suspicious. Electronically Signed   By: Kristopher Oppenheim M.D.   On: 08/30/2021 10:46  MM DIAG BREAST TOMO BILATERAL  Result Date: 08/30/2021 CLINICAL DATA:  41 year old female with a palpable right breast lump for 2-3 months in a physician palpated left breast lump. EXAM: DIGITAL DIAGNOSTIC BILATERAL MAMMOGRAM WITH TOMOSYNTHESIS AND CAD; ULTRASOUND LEFT BREAST LIMITED; ULTRASOUND RIGHT BREAST LIMITED TECHNIQUE: Bilateral digital diagnostic mammography and breast tomosynthesis was performed. The images were evaluated with computer-aided detection.; Targeted ultrasound examination of the left breast was performed.; Targeted ultrasound examination of the right breast was performed COMPARISON:  Previous exam(s). ACR Breast Density Category d: The breast tissue is extremely dense, which  lowers the sensitivity of mammography. FINDINGS: A radiopaque BB was placed at the site of the Kathryn Mckenzie's palpable right breast lump in the upper outer quadrant posteriorly. Focal distortion is seen just deep to the radiopaque BB. An additional BB was placed in the upper-outer quadrant of the left breast, with no focal or underlying mammographic findings. No additional suspicious mammographic findings in the remainder of either breast. Targeted ultrasound is performed, showing an irregular, hypoechoic mass with shadowing and  associated vascularity at the 10 o'clock position 8 cm from the nipple on the right. It measures 1.1 x 1.2 x 0.8 cm. This corresponds with the Kathryn Mckenzie's palpable lump in focal distortion. Evaluation of the right axilla demonstrates no suspicious lymphadenopathy. Ultrasound evaluation of the upper outer left breast demonstrates no focal findings at the site of the Kathryn Mckenzie's palpable lump. A circumscribed, anechoic cyst measuring up to 1.3 cm is incidentally noted at the 1 o'clock position 5 cm from the nipple. IMPRESSION: 1. Suspicious right breast mass at the 10 o'clock position 8 cm from the nipple. Recommendation is for ultrasound-guided biopsy. 2. No suspicious right axillary lymphadenopathy. 3. No mammographic or sonographic evidence of malignancy on the left. RECOMMENDATION: 1. Ultrasound-guided biopsy of the right breast. 2. Pending biopsy results, further evaluation with contrast enhanced breast MRI is recommended given the Kathryn Mckenzie's extreme breast density. I have discussed the findings and recommendations with the Kathryn Mckenzie. If applicable, a reminder letter will be sent to the Kathryn Mckenzie regarding the next appointment. BI-RADS CATEGORY  4: Suspicious. Electronically Signed   By: Kristopher Oppenheim M.D.   On: 08/30/2021 10:46  MM CLIP PLACEMENT RIGHT  Result Date: 09/02/2021 CLINICAL DATA:  Evaluate biopsy marker EXAM: 3D DIAGNOSTIC RIGHT MAMMOGRAM POST ULTRASOUND BIOPSY COMPARISON:  Previous exam(s). FINDINGS: 3D Mammographic images were obtained following ultrasound guided biopsy of a right breast mass. Biopsy clip is along the inferior medial aspect of the breast mass located approximately 5.8 mm medial and 5 mm inferior to the center of the mass. IMPRESSION: The biopsy clip is in the inferior medial aspect of the biopsied right breast mass. The ribbon shaped clip is 5.8 mm medial and 5 mm inferior to the center of the mass. Final Assessment: Post Procedure Mammograms for Marker Placement Electronically Signed    By: Dorise Bullion III M.D.   On: 09/02/2021 09:00  [place] RT BREAST BX W LOC DEV 1ST LESION IMG BX SPEC US GUIDE  Addendum Date: 09/08/2021   ADDENDUM REPORT: 09/08/2021 19:45 ADDENDUM: Pathology revealed GRADE I INVASIVE DUCTAL CARCINOMA, DUCTAL CARCINOMA IN SITU of the RIGHT breast, 10 o'clock, (ribbon clip). The carcinoma appears to be arising within a fibroadenoma. This was found to be concordant by Dr. Dorise Bullion. Pathology results were discussed with the Kathryn Mckenzie by telephone. The Kathryn Mckenzie reported doing well after the biopsy with tenderness at the site. Post biopsy instructions and care were reviewed and questions were answered. The Kathryn Mckenzie was encouraged to call [facility] for any additional concerns. My direct phone number was provided. The Kathryn Mckenzie was referred to [facility] on September 08, 2021. Further evaluation with contrast enhanced breast MRI is recommended to exclude any additional sites of disease given the Kathryn Mckenzie's extreme breast density and premenopausal status. Pathology results reported by Terie Purser, RN on 09/06/2021. Electronically Signed   By: Dorise Bullion III M.D.   On: 09/08/2021 19:45   Result Date: 09/08/2021 CLINICAL DATA:  Biopsy of  a 10 o'clock right breast mass EXAM: ULTRASOUND GUIDED RIGHT BREAST CORE NEEDLE BIOPSY COMPARISON:  Previous exam(s). PROCEDURE: I met with the Kathryn Mckenzie and we discussed the procedure of ultrasound-guided biopsy, including benefits and alternatives. We discussed the high likelihood of a successful procedure. We discussed the risks of the procedure, including infection, bleeding, tissue injury, clip migration, and inadequate sampling. Informed written consent was given. The usual time-out protocol was performed immediately prior to the procedure. Lesion quadrant: 10 o'clock right breast Using sterile technique and 1% Lidocaine as local  anesthetic, under direct ultrasound visualization, a 12 gauge spring-loaded device was used to perform biopsy of a 10 o'clock right breast mass using a lateral approach. At the conclusion of the procedure a ribbon shaped tissue marker clip was deployed into the biopsy cavity. Follow up 2 view mammogram was performed and dictated separately. IMPRESSION: Ultrasound guided biopsy of a 10 o'clock right breast mass. No apparent complications. Electronically Signed: By: Dorise Bullion III M.D. On: 09/02/2021 08:33     IMPRESSION/PLAN: This is a delightful 41 yo woman with stage I ER+  right breast cancer.  She has been discussed at our multidisciplinary tumor board.  The consensus is that she may be a good candidate for breast conservation pending further breast imaging (MRI). I talked to her about the option of a mastectomy and informed her that her expected overall survival would be equivalent between mastectomy and breast conservation, based upon randomized controlled data, if her genetic testing is negative She is considering breast conservation and also bilateral mastectomies.  She is eager to meet with genetics and understand her lifetime risk of breast cancer.   It was a pleasure meeting the Kathryn Mckenzie today. We discussed the risks, benefits, and side effects of radiotherapy. If she undergoes lumpectomy, I recommend radiotherapy to the right breast to reduce her risk of locoregional recurrence by 2/3.  We discussed that radiation would take approximately 4 - 6 weeks to complete and that I would give the Kathryn Mckenzie a few weeks to heal following surgery before starting treatment planning.  If chemotherapy were to be given, this would precede radiotherapy. We spoke about acute effects including skin irritation and fatigue as well as much less common late effects including internal organ injury or irritation. We spoke about the latest technology that is used to minimize the risk of late effects for patients undergoing  radiotherapy to the breast or chest wall. No guarantees of treatment were given. The Kathryn Mckenzie is enthusiastic about proceeding with treatment. I look forward to participating in the Kathryn Mckenzie's care.  I will await her referral back to me for postoperative follow-up and eventual CT simulation/treatment planning.  Of note, she understands that a minority of patients in her circumstances will still need radiation therapy in spite of mastectomy (ie + margins, +node(s), larger tumor than expected).  She knows that mastectomy is not a guaranteed method to avoid radiation therapy.    She is also wondering about ideal screening methods in the future if she conserves her breast. I recommended she ask to speak with her radiologist after her MRI results are available to inquire about this further.  On date of service, in total, I spent 60 minutes on this encounter. Kathryn Mckenzie was seen in person.   __________________________________________   Eppie Gibson, MD  This document serves as a record of services personally performed by Eppie Gibson, MD. It was created on her behalf by Roney Mans, a trained medical scribe. The creation of this record  is based on the scribe's personal observations and the provider's statements to them. This document has been checked and approved by the attending provider.

## 2021-09-08 ENCOUNTER — Encounter: Payer: Self-pay | Admitting: *Deleted

## 2021-09-08 ENCOUNTER — Encounter: Payer: Self-pay | Admitting: Hematology and Oncology

## 2021-09-08 ENCOUNTER — Inpatient Hospital Stay: Payer: No Typology Code available for payment source | Admitting: Hematology and Oncology

## 2021-09-08 ENCOUNTER — Other Ambulatory Visit: Payer: Self-pay | Admitting: *Deleted

## 2021-09-08 ENCOUNTER — Inpatient Hospital Stay: Payer: No Typology Code available for payment source | Attending: Hematology and Oncology

## 2021-09-08 ENCOUNTER — Inpatient Hospital Stay (HOSPITAL_BASED_OUTPATIENT_CLINIC_OR_DEPARTMENT_OTHER): Payer: No Typology Code available for payment source | Admitting: Genetic Counselor

## 2021-09-08 ENCOUNTER — Ambulatory Visit: Payer: No Typology Code available for payment source | Attending: General Surgery | Admitting: Physical Therapy

## 2021-09-08 ENCOUNTER — Other Ambulatory Visit: Payer: Self-pay

## 2021-09-08 ENCOUNTER — Encounter: Payer: Self-pay | Admitting: Physical Therapy

## 2021-09-08 ENCOUNTER — Encounter: Payer: Self-pay | Admitting: General Practice

## 2021-09-08 ENCOUNTER — Ambulatory Visit
Admission: RE | Admit: 2021-09-08 | Discharge: 2021-09-08 | Disposition: A | Discharge status: Home / Self Care | Payer: No Typology Code available for payment source | Source: Ambulatory Visit | Attending: Radiation Oncology | Admitting: Radiation Oncology

## 2021-09-08 DIAGNOSIS — R450 Nervousness: Secondary | ICD-10-CM | POA: Insufficient documentation

## 2021-09-08 DIAGNOSIS — Z170 Estrogen receptor positive status [ER+]: Secondary | ICD-10-CM

## 2021-09-08 DIAGNOSIS — C50411 Malignant neoplasm of upper-outer quadrant of right female breast: Secondary | ICD-10-CM

## 2021-09-08 DIAGNOSIS — F419 Anxiety disorder, unspecified: Secondary | ICD-10-CM | POA: Insufficient documentation

## 2021-09-08 DIAGNOSIS — E039 Hypothyroidism, unspecified: Secondary | ICD-10-CM | POA: Insufficient documentation

## 2021-09-08 DIAGNOSIS — R293 Abnormal posture: Secondary | ICD-10-CM | POA: Insufficient documentation

## 2021-09-08 DIAGNOSIS — Z79899 Other long term (current) drug therapy: Secondary | ICD-10-CM | POA: Diagnosis not present

## 2021-09-08 DIAGNOSIS — C4491 Basal cell carcinoma of skin, unspecified: Secondary | ICD-10-CM | POA: Insufficient documentation

## 2021-09-08 DIAGNOSIS — N63 Unspecified lump in unspecified breast: Secondary | ICD-10-CM | POA: Insufficient documentation

## 2021-09-08 DIAGNOSIS — Z800 Family history of malignant neoplasm of digestive organs: Secondary | ICD-10-CM

## 2021-09-08 DIAGNOSIS — F5101 Primary insomnia: Secondary | ICD-10-CM | POA: Insufficient documentation

## 2021-09-08 DIAGNOSIS — R7989 Other specified abnormal findings of blood chemistry: Secondary | ICD-10-CM | POA: Insufficient documentation

## 2021-09-08 DIAGNOSIS — I7300 Raynaud's syndrome without gangrene: Secondary | ICD-10-CM | POA: Insufficient documentation

## 2021-09-08 DIAGNOSIS — Z803 Family history of malignant neoplasm of breast: Secondary | ICD-10-CM

## 2021-09-08 LAB — CBC WITH DIFFERENTIAL (CANCER CENTER ONLY)
Abs Immature Granulocytes: 0.02 10*3/uL (ref 0.00–0.07)
Basophils Absolute: 0.1 10*3/uL (ref 0.0–0.1)
Basophils Relative: 1 %
Eosinophils Absolute: 0.1 10*3/uL (ref 0.0–0.5)
Eosinophils Relative: 1 %
HCT: 38.3 % (ref 36.0–46.0)
Hemoglobin: 12.9 g/dL (ref 12.0–15.0)
Immature Granulocytes: 0 %
Lymphocytes Relative: 32 %
Lymphs Abs: 1.9 10*3/uL (ref 0.7–4.0)
MCH: 32.3 pg (ref 26.0–34.0)
MCHC: 33.7 g/dL (ref 30.0–36.0)
MCV: 95.8 fL (ref 80.0–100.0)
Monocytes Absolute: 0.5 10*3/uL (ref 0.1–1.0)
Monocytes Relative: 9 %
Neutro Abs: 3.4 10*3/uL (ref 1.7–7.7)
Neutrophils Relative %: 57 %
Platelet Count: 239 10*3/uL (ref 150–400)
RBC: 4 MIL/uL (ref 3.87–5.11)
RDW: 12.1 % (ref 11.5–15.5)
WBC Count: 5.9 10*3/uL (ref 4.0–10.5)
nRBC: 0 % (ref 0.0–0.2)

## 2021-09-08 LAB — GENETIC SCREENING ORDER

## 2021-09-08 LAB — CMP (CANCER CENTER ONLY)
ALT: 15 U/L (ref 0–44)
AST: 18 U/L (ref 15–41)
Albumin: 4.5 g/dL (ref 3.5–5.0)
Alkaline Phosphatase: 39 U/L (ref 38–126)
Anion gap: 6 (ref 5–15)
BUN: 15 mg/dL (ref 6–20)
CO2: 28 mmol/L (ref 22–32)
Calcium: 9.6 mg/dL (ref 8.9–10.3)
Chloride: 105 mmol/L (ref 98–111)
Creatinine: 0.97 mg/dL (ref 0.44–1.00)
GFR, Estimated: >60 mL/min (ref >60)
Glucose, Bld: 99 mg/dL (ref 70–99)
Potassium: 4.6 mmol/L (ref 3.5–5.1)
Sodium: 139 mmol/L (ref 135–145)
Total Bilirubin: 0.4 mg/dL (ref 0.3–1.2)
Total Protein: 7 g/dL (ref 6.5–8.1)

## 2021-09-08 NOTE — Therapy (Addendum)
OUTPATIENT PHYSICAL THERAPY BREAST CANCER BASELINE EVALUATION   Patient Name: Kathryn Mckenzie MRN: [id number] DOB:09/07/1980, 40 y.o., female Today's Date: 09/08/2021   PT End of Session - 09/08/21 1416     Visit Number 1    Number of Visits 2    Date for PT Re-Evaluation 11/03/21    PT Start Time 2703    PT Stop Time 1545    PT Time Calculation (min) 34 min    Activity Tolerance Patient tolerated treatment well    Behavior During Therapy [facility] for tasks assessed/performed             Past Medical History:  Diagnosis Date   Breast cancer [facility])    Past Surgical History:  Procedure Laterality Date   CESAREAN SECTION     TONSILLECTOMY     Patient Active Problem List   Diagnosis Date Noted   Malignant neoplasm of upper-outer quadrant of right breast in female, estrogen receptor positive ([place]) 09/07/2021    PCP: Holland Commons, FNP  REFERRING PROVIDER: Jovita Kussmaul, MD  REFERRING DIAG: Right breast cancer  THERAPY DIAG:  Malignant neoplasm of upper-outer quadrant of right breast in female, estrogen receptor positive ([place])  Abnormal posture  ONSET DATE: 08/30/2021  SUBJECTIVE                                                                                                                                                                                           SUBJECTIVE STATEMENT: Patient reports she is here today to be seen by her medical team for her newly diagnosed right breast cancer.   PERTINENT HISTORY:  Patient was diagnosed on 08/30/2021 with right grade I invasive ductal carcinoma breast cancer. It measures 1.2 cm and is located in the upper outer quadrant. It is ER/PR positive and HER2 negative with a Ki67 of 1%.   PATIENT GOALS   reduce lymphedema risk and learn post op HEP.   PAIN:  Are you having pain? No  PRECAUTIONS: Active CA  HAND DOMINANCE: right  WEIGHT BEARING RESTRICTIONS No  FALLS:  Has patient fallen in last 6 months? No, Number of  falls: 0  LIVING ENVIRONMENT: Patient lives with: husband, 72, and 46 y.o. kids Lives in: House/apartment Has following equipment at home: None  OCCUPATION: Futures trader  LEISURE: She does [employer], theatre/television/film 3-4x/week for 1 hour  PRIOR LEVEL OF FUNCTION: Independent   OBJECTIVE  COGNITION:  Overall cognitive status: Within functional limits for tasks assessed    POSTURE:  Forward head and rounded shoulders posture  UPPER EXTREMITY AROM/PROM:  A/PROM RIGHT  09/08/2021   Shoulder extension 57  Shoulder flexion 157  Shoulder abduction 160  Shoulder internal rotation 65  Shoulder external rotation 90    (Blank rows = not tested)  A/PROM LEFT  09/08/2021  Shoulder extension 61  Shoulder flexion 146  Shoulder abduction 169  Shoulder internal rotation 71  Shoulder external rotation 85    (Blank rows = not tested)   CERVICAL AROM: All within normal limits    UPPER EXTREMITY STRENGTH: WNL   LYMPHEDEMA ASSESSMENTS:   LANDMARK RIGHT  09/08/2021  10 cm proximal to olecranon process 26.6  Olecranon process 22.6  10 cm proximal to ulnar styloid process 20.5  Just proximal to ulnar styloid process 14.4  Across hand at thumb web space 18.2  At base of 2nd digit 6  (Blank rows = not tested)  LANDMARK LEFT  09/08/2021  10 cm proximal to olecranon process 26.7  Olecranon process 22.4  10 cm proximal to ulnar styloid process 20  Just proximal to ulnar styloid process 13.9  Across hand at thumb web space 17.7  At base of 2nd digit 5.9  (Blank rows = not tested)   L-DEX LYMPHEDEMA SCREENING:  The patient was assessed using the L-Dex machine today to produce a lymphedema index baseline score. The patient will be reassessed on a regular basis (typically every 3 months) to obtain new L-Dex scores. If the score is > 6.5 points away from his/her baseline score indicating onset of subclinical lymphedema, it will be recommended to wear a compression garment for 4 weeks, 12 hours  per day and then be reassessed. If the score continues to be > 6.5 points from baseline at reassessment, we will initiate lymphedema treatment. Assessing in this manner has a 95% rate of preventing clinically significant lymphedema.   L-DEX FLOWSHEETS - 09/08/21 1400       L-DEX LYMPHEDEMA SCREENING   Measurement Type Unilateral    L-DEX MEASUREMENT EXTREMITY Upper Extremity    POSITION  Standing    DOMINANT SIDE Right    At Risk Side Right    BASELINE SCORE (UNILATERAL) -1.7              QUICK DASH SURVEY:  Kathryn Mckenzie - 09/08/21 0001     Open a tight or new jar No difficulty    Do heavy household chores (wash walls, wash floors) No difficulty    Carry a shopping bag or briefcase Mild difficulty    Wash your back No difficulty    Use a knife to cut food No difficulty    Recreational activities in which you take some force or impact through your arm, shoulder, or hand (golf, hammering, tennis) No difficulty    During the past week, to what extent has your arm, shoulder or hand problem interfered with your normal social activities with family, friends, neighbors, or groups? Not at all    During the past week, to what extent has your arm, shoulder or hand problem limited your work or other regular daily activities Slightly    Arm, shoulder, or hand pain. Mild    Tingling (pins and needles) in your arm, shoulder, or hand None    Difficulty Sleeping Mild difficulty    DASH Score 9.09 %              PATIENT EDUCATION:  Education details: Lymphedema risk reduction and post op shoulder/posture HEP Person educated: Patient Education method: Explanation, Demonstration, Handout Education comprehension: Patient verbalized understanding and returned demonstration   HOME EXERCISE PROGRAM: Patient was instructed  today in a home exercise program today for post op shoulder range of motion. These included active assist shoulder flexion in sitting, scapular retraction, wall walking  with shoulder abduction, and hands behind head external rotation.  She was encouraged to do these twice a day, holding 3 seconds and repeating 5 times when permitted by her physician.   ASSESSMENT:  CLINICAL IMPRESSION: Patient was diagnosed on 08/30/2021 with right grade I invasive ductal carcinoma breast cancer. It measures 1.2 cm and is located in the upper outer quadrant. It is ER/PR positive and HER2 negative with a Ki67 of 1%. Her multidisciplinary medical team met prior to her assessments to determine a recommended treatment plan. She is planning to have a right lumpectomy and sentinel node biopsy followed by Oncotype testing, radiation, and anti-estrogen therapy. She will benefit from a post op PT reassessment to determine needs and from L-Dex screens every 3 months for 2 years to detect subclinical lymphedema.  Pt will benefit from skilled therapeutic intervention to improve on the following deficits: Decreased knowledge of precautions, impaired UE functional use, pain, decreased ROM, postural dysfunction.   PT treatment/interventions: ADL/self-care home management, pt/family education, therapeutic exercise  REHAB POTENTIAL: Excellent  CLINICAL DECISION MAKING: Stable/uncomplicated  EVALUATION COMPLEXITY: Low   GOALS: Goals reviewed with patient? YES  LONG TERM GOALS: (STG=LTG)   Name Target Date Goal status  1 Pt will be able to verbalize understanding of pertinent lymphedema risk reduction practices relevant to her dx specifically related to skin care.  Baseline:  No knowledge 09/08/2021 Achieved at eval  2 Pt will be able to return demo and/or verbalize understanding of the post op HEP related to regaining shoulder ROM. Baseline:  No knowledge 09/08/2021 Achieved at eval  3 Pt will be able to verbalize understanding of the importance of attending the post op After Breast CA Class for further lymphedema risk reduction education and therapeutic exercise.  Baseline:  No knowledge  09/08/2021 Achieved at eval  4 Pt will demo she has regained full shoulder ROM and function post operatively compared to baselines.  Baseline: See objective measurements taken today. 11/03/2021      PLAN: PT FREQUENCY/DURATION: EVAL and 1 follow up appointment.   PLAN FOR NEXT SESSION: will reassess 3-4 weeks post op to determine needs.   Patient will follow up at outpatient cancer rehab 3-4 weeks following surgery.  If the patient requires physical therapy at that time, a specific plan will be dictated and sent to the referring physician for approval. The patient was educated today on appropriate basic range of motion exercises to begin post operatively and the importance of attending the After Breast Cancer class following surgery.  Patient was educated today on lymphedema risk reduction practices as it pertains to recommendations that will benefit the patient immediately following surgery.  She verbalized good understanding.    Physical Therapy Information for After Breast Cancer Surgery/Treatment:  Lymphedema is a swelling condition that you may be at risk for in your arm if you have lymph nodes removed from the armpit area.  After a sentinel node biopsy, the risk is approximately 5-9% and is higher after an axillary node dissection.  There is treatment available for this condition and it is not life-threatening.  Contact your physician or physical therapist with concerns. You may begin the 4 shoulder/posture exercises (see additional sheet) when permitted by your physician (typically a week after surgery).  If you have drains, you may need to wait until those are removed before beginning  range of motion exercises.  A general recommendation is to not lift your arms above shoulder height until drains are removed.  These exercises should be done to your tolerance and gently.  This is not a "no pain/no gain" type of recovery so listen to your body and stretch into the range of motion that you can  tolerate, stopping if you have pain.  If you are having immediate reconstruction, ask your plastic surgeon about doing exercises as he or she may want you to wait. We encourage you to attend the free one time ABC (After Breast Cancer) class offered by [facility].  You will learn information related to lymphedema risk, prevention and treatment and additional exercises to regain mobility following surgery.  You can call [phone] for more information.  This is offered the 1st and 3rd Monday of each month.  You only attend the class one time. While undergoing any medical procedure or treatment, try to avoid blood pressure being taken or needle sticks from occurring on the arm on the side of cancer.   This recommendation begins after surgery and continues for the rest of your life.  This may help reduce your risk of getting lymphedema (swelling in your arm). An excellent resource for those seeking information on lymphedema is the National Lymphedema Network's web site. It can be accessed at [place].org If you notice swelling in your hand, arm or breast at any time following surgery (even if it is many years from now), please contact your doctor or physical therapist to discuss this.  Lymphedema can be treated at any time but it is easier for you if it is treated early on.  If you feel like your shoulder motion is not returning to normal in a reasonable amount of time, please contact your surgeon or physical therapist.  [facility] [phone]. [address],  [place] [address]  ABC CLASS After Breast Cancer Class  After Breast Cancer Class is a specially designed exercise class to assist you in a safe recover after having breast cancer surgery.  In this class you will learn how to get back to full function whether your drains were just removed or if you had surgery a month ago.  This one-time class is held the 1st and 3rd Monday  of every month from 11:00 a.m. until 12:00 noon virtually.  This class is FREE and space is limited. For more information or to register for the next available class, call [phone].  Class Goals  Understand specific stretches to improve the flexibility of you chest and shoulder. Learn ways to safely strengthen your upper body and improve your posture. Understand the warning signs of infection and why you may be at risk for an arm infection. Learn about Lymphedema and prevention.  ** You do not attend this class until after surgery.  Drains must be removed to participate  Patient was instructed today in a home exercise program today for post op shoulder range of motion. These included active assist shoulder flexion in sitting, scapular retraction, wall walking with shoulder abduction, and hands behind head external rotation.  She was encouraged to do these twice a day, holding 3 seconds and repeating 5 times when permitted by her physician.    Cipriana Biller,MARTI COOPER, PT 09/08/2021, 3:51 PM

## 2021-09-08 NOTE — Progress Notes (Signed)
[place] Psychosocial Distress Screening Spiritual Care  Met with Kathryn Mckenzie, her husband Annie Main, and her dad in Breast Multidisciplinary Clinic to introduce [place] team/resources, reviewing distress screen per protocol.  The patient scored a 7 on the Psychosocial Distress Thermometer which indicates severe distress. Also assessed for distress and other psychosocial needs.   ONCBCN DISTRESS SCREENING 09/08/2021  Screening Type Initial Screening  Distress experienced in past week (1-10) 7  Emotional problem type Nervousness/Anxiety;Adjusting to illness  Information Concerns Type Lack of info about diagnosis;Lack of info about treatment;Lack of info about complementary therapy choices;Lack of info about maintaining fitness  Physical Problem type Sleep/insomnia  Referral to support programs Yes   [name] describes herself as a very active person who likes to make decisions and take action, and tends a bit toward anxiety. This diagnosis stirs up concerns about maintaining her lifestyle and commitments, as well as difficulty sleeping because of distress. At the moment, the most distressing part is making the decision between lumpectomy and mastectomy, and she anticipates that the wait for more information (genetic testing, etc) will be challenging. Exercise and activity help her cope. She and her husband plan to tell their children, 12 and 64, about her diagnosis tonight.  Provided empathic listening, normalization of feelings, discussion of factors to consider when talking with children, and introduction to [employer].   Follow up needed: No. Mailing [name] a [employer] brochure for her children. She is aware of ongoing chaplain availability and prefers to reach out as needed/desired.   [place], [place], [facility] Pager [phone] Voicemail [phone]

## 2021-09-08 NOTE — Research (Addendum)
Trial:  [employer] 2021-05 - Specimen Collection Study to Evaluate Biomarkers in Subjects with Cancer    Patient Kathryn Mckenzie was identified by Dr Lindi Adie as a potential candidate for the above listed study.  This Clinical Research Nurse met with Kathryn Mckenzie, [id number], on 09/08/21 in a manner and location that ensures patient privacy to discuss participation in the above listed research study.  Patient is Accompanied by her husband and father .  A copy of the informed consent document with embedded HIPAA language was provided to the patient.  Patient reads, speaks, and understands [place].   Patient was provided with the business card of this Nurse and encouraged to contact the research team with any questions.  Approximately 15 minutes were spent with the patient reviewing the informed consent documents.  Patient was provided the option of taking informed consent documents home to review and was encouraged to review at their convenience with their support network, including other care providers. Patient took the consent documents home to review. Plan to follow up later in the week with patient to gauge interest.  Marjie Skiff. Chamari Cutbirth, RN, BSN, [facility]   Her   [facility] Clinical Research Nurse [place] [phone]   Pager [phone] 09/08/2021 3:58 PM

## 2021-09-08 NOTE — Progress Notes (Signed)
REFERRING PROVIDER: Nicholas Lose, MD [place], [place] [address]  PRIMARY PROVIDER:  Holland Commons, FNP  PRIMARY REASON FOR VISIT:  Encounter Diagnoses  Name Primary?   Malignant neoplasm of upper-outer quadrant of right breast in female, estrogen receptor positive ([place]) Yes   Family history of breast cancer    Family history of pancreatic cancer    HISTORY OF PRESENT ILLNESS:   Kathryn Mckenzie, a 41 y.o. female, was seen for a [facility] cancer genetics consultation during the breast multidisciplinary clinic at the request of Dr. Lindi Adie due to a personal and family history of cancer.  Kathryn Mckenzie presents to clinic today to discuss the possibility of a hereditary predisposition to cancer, to discuss genetic testing, and to further clarify her future cancer risks, as well as potential cancer risks for family members.   In February 2023, at the age of 11, Kathryn Mckenzie was diagnosed with invasive ductal carcinoma of the right breast. The treatment plan is pending her genetic test results.   CANCER HISTORY:  Oncology History  Malignant neoplasm of upper-outer quadrant of right breast in female, estrogen receptor positive ([place])  09/02/2021 Initial Diagnosis   Palpable right breast lump for 2 to 3 months UOQ.  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  First live birth at age 65.  OCP use for approximately 6 years.  Ovaries intact: yes.  Uterus intact: yes.  Menopausal status: premenopausal.  HRT use: 0 years. Colonoscopy: no Mammogram within the last year: yes. Any excessive radiation exposure in the past: no  Past Medical History:  Diagnosis Date   Breast cancer [facility])     Past Surgical History:  Procedure Laterality Date   CESAREAN SECTION     TONSILLECTOMY      Social History   Socioeconomic History    Marital status: Married    Spouse name: Not on file   Number of children: Not on file   Years of education: Not on file   Highest education level: Not on file  Occupational History   Not on file  Tobacco Use   Smoking status: Never   Smokeless tobacco: Never  Substance and Sexual Activity   Alcohol use: Yes   Drug use: Never   Sexual activity: Not on file  Other Topics Concern   Not on file  Social History Narrative   Not on file   Social Determinants of Health   Financial Resource Strain: Not on file  Food Insecurity: Not on file  Transportation Needs: Not on file  Physical Activity: Not on file  Stress: Not on file  Social Connections: Not on file     FAMILY HISTORY:  We obtained a detailed, 4-generation family history.  Significant diagnoses are listed below: Family History  Problem Relation Age of Onset   Melanoma Maternal Aunt    Melanoma Maternal Uncle    Breast cancer Maternal Grandmother 75   Pancreatic cancer Paternal Grandmother 26       Kathryn Mckenzie reports a maternal aunt and uncle diagnosed with melanoma. Her maternal grandmother was diagnosed with breast cancer at age 79 and died at age 39. Kathryn Mckenzie's paternal grandmother was diagnosed with pancreatic cancer at age 14, she died at 69. A paternal great aunt (grandfather's sister) was diagnosed with ovarian cancer, she  is deceased.  Kathryn Mckenzie is unaware of previous family history of genetic testing for hereditary cancer risks. There is no reported Ashkenazi Jewish ancestry.  GENETIC COUNSELING ASSESSMENT: Kathryn Mckenzie is a 41 y.o. female with a personal and family history of cancer which is somewhat suggestive of a hereditary cancer syndrome and predisposition to cancer given her young age at diagnosis. We, therefore, discussed and recommended the following at today's visit.   DISCUSSION: We discussed that 5 - 10% of cancer is hereditary, with most cases of hereditary breast cancer associated with mutations in BRCA1/2.   There are other genes that can be associated with hereditary breast cancer syndromes. Type of cancer risk and level of risk are gene-specific. We discussed that testing is beneficial for several reasons including knowing how to follow individuals after completing their treatment, identifying whether potential treatment options would be beneficial, and understanding if other family members could be at risk for cancer and allowing them to undergo genetic testing.   We reviewed the characteristics, features and inheritance patterns of hereditary cancer syndromes. We also discussed genetic testing, including the appropriate family members to test, the process of testing, insurance coverage and turn-around-time for results. We discussed the implications of a negative, positive and/or variant of uncertain significant result. In order to get genetic test results in a timely manner so that Kathryn Mckenzie can use these genetic test results for surgical decisions, we recommended Kathryn Mckenzie pursue genetic testing for the BRCAplus. Once complete, we recommend Kathryn Mckenzie pursue reflex genetic testing to a more comprehensive gene panel.   Kathryn Mckenzie  was offered a common hereditary cancer panel (47 genes) and an expanded pan-cancer panel (77 genes). Kathryn Mckenzie was informed of the benefits and limitations of each panel, including that expanded pan-cancer panels contain genes that do not have clear management guidelines at this point in time.  We also discussed that as the number of genes included on a panel increases, the chances of variants of uncertain significance increases.  After considering the benefits and limitations of each gene panel, Kathryn Mckenzie elected to have [facility] CancerNext-Expanded Panel+RNA.  The CancerNext-Expanded gene panel offered by [facility] and includes sequencing, rearrangement, and RNA analysis for the following 77 genes: AIP, ALK, APC, ATM, AXIN2, BAP1, BARD1, BLM, BMPR1A, BRCA1, BRCA2, BRIP1, CDC73, CDH1, CDK4,  CDKN1B, CDKN2A, CHEK2, CTNNA1, DICER1, FANCC, FH, FLCN, GALNT12, KIF1B, LZTR1, MAX, MEN1, MET, MLH1, MSH2, MSH3, MSH6, MUTYH, NBN, NF1, NF2, NTHL1, PALB2, PHOX2B, PMS2, POT1, PRKAR1A, PTCH1, PTEN, RAD51C, RAD51D, RB1, RECQL, RET, SDHA, SDHAF2, SDHB, SDHC, SDHD, SMAD4, SMARCA4, SMARCB1, SMARCE1, STK11, SUFU, TMEM127, TP53, TSC1, TSC2, VHL and XRCC2 (sequencing and deletion/duplication); EGFR, EGLN1, HOXB13, KIT, MITF, PDGFRA, POLD1, and POLE (sequencing only); EPCAM and GREM1 (deletion/duplication only).   Based on Kathryn Mckenzie's personal and family history of cancer, she meets medical criteria for genetic testing. Despite that she meets criteria, she may still have an out of pocket cost. We discussed that if her out of pocket cost for testing is over $100, the laboratory should contact them to discuss self-pay prices, patient pay assistance programs, if applicable, and other billing options.   PLAN: After considering the risks, benefits, and limitations, Kathryn Mckenzie provided informed consent to pursue genetic testing and the blood sample was sent to [facility] for analysis of the [employer]. Results should be available within approximately 1-2 weeks' time, at which point they will be disclosed by telephone to Kathryn Mckenzie, as will any additional recommendations warranted by  these results. Kathryn Mckenzie will receive a summary of her genetic counseling visit and a copy of her results once available. This information will also be available in Epic.   Kathryn Mckenzie's questions were answered to her satisfaction today. Our contact information was provided should additional questions or concerns arise. Thank you for the referral and allowing [place] to share in the care of your patient.   Lucille Passy, MS, [facility] Genetic Counselor [email or web address] (P) [phone]  The patient was seen for a total of 20 minutes in face-to-face genetic counseling.  The patient brought her husband and father. Drs.  Lindi Adie and/or Burr Medico were available to discuss this case as needed.  _______________________________________________________________________ For Office Staff:  Number of people involved in session: 3 Was an Intern/ student involved with case: no

## 2021-09-08 NOTE — Assessment & Plan Note (Signed)
09/02/2021:Palpable right breast lump for 2 to 3 months UOQ.  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 After reviewing the pathology in detail, we proceeded to discuss the different treatment options between surgery, radiation, chemotherapy, antiestrogen therapies.  Recommendations: Breast MRI to be done because of high breast density 1. Breast conserving surgery followed by 2. Oncotype DX testing to determine if chemotherapy would be of any benefit followed by 3. Adjuvant radiation therapy followed by 4. Adjuvant antiestrogen therapy 5.  Genetic counseling  Oncotype counseling: I discussed Oncotype DX test. I explained to the patient that this is a 21 gene panel to evaluate patient tumors DNA to calculate recurrence score. This would help determine whether patient has high risk or low risk breast cancer. She understands that if her tumor was found to be high risk, she would benefit from systemic chemotherapy. If low risk, no need of chemotherapy.  Return to clinic after surgery to discuss final pathology report and then determine if Oncotype DX testing will need to be sent.

## 2021-09-08 NOTE — Addendum Note (Signed)
Addended by: Arbutus Ped C on: 09/08/2021 03:55 PM   Modules accepted: Orders

## 2021-09-09 ENCOUNTER — Telehealth: Payer: Self-pay | Admitting: Hematology and Oncology

## 2021-09-09 ENCOUNTER — Encounter: Payer: Self-pay | Admitting: Genetic Counselor

## 2021-09-09 DIAGNOSIS — Z803 Family history of malignant neoplasm of breast: Secondary | ICD-10-CM | POA: Insufficient documentation

## 2021-09-09 DIAGNOSIS — Z800 Family history of malignant neoplasm of digestive organs: Secondary | ICD-10-CM | POA: Insufficient documentation

## 2021-09-09 DIAGNOSIS — C50919 Malignant neoplasm of unspecified site of unspecified female breast: Secondary | ICD-10-CM | POA: Insufficient documentation

## 2021-09-09 NOTE — Telephone Encounter (Signed)
Sch per 2/15 inbasket , left msg reguarding gen co appt

## 2021-09-10 ENCOUNTER — Telehealth: Payer: Self-pay

## 2021-09-10 ENCOUNTER — Encounter: Payer: Self-pay | Admitting: Radiation Oncology

## 2021-09-10 NOTE — Telephone Encounter (Signed)
[employer] 2021-05 - Specimen Collection Study to Evaluate Biomarkers in Subjects with Cancer    Spoke with Ms Evinger to confirm interest in study. She has her next appt scheduled in about a month. We discussed needing to get blood prior to treatment, and I will watch her chart to ensure we get her in before anything is scheduled. Otherwise we will plan to meet and get research labs at her next appt in March.  Marjie Skiff Devetta Hagenow, RN, BSN, [facility] She   [facility]   [facility] Clinical Research Nurse [facility] Direct Dial [phone]   Pager [phone] 09/10/2021 11:36 AM

## 2021-09-13 ENCOUNTER — Other Ambulatory Visit: Payer: No Typology Code available for payment source

## 2021-09-14 ENCOUNTER — Telehealth: Payer: Self-pay | Admitting: *Deleted

## 2021-09-14 NOTE — Telephone Encounter (Signed)
Pt called in regards to breast MRI being cx for 2/20 d/t insurance needing more information. Return pt call, left vm informing following up with Dr. Ethlyn Gallery office in regards to insurance verification as well as getting MRI r/s asap. Contact information provided.

## 2021-09-16 ENCOUNTER — Encounter: Payer: Self-pay | Admitting: *Deleted

## 2021-09-20 ENCOUNTER — Encounter: Payer: Self-pay | Admitting: Genetic Counselor

## 2021-09-20 ENCOUNTER — Telehealth: Payer: Self-pay | Admitting: *Deleted

## 2021-09-20 ENCOUNTER — Telehealth: Payer: Self-pay | Admitting: Genetic Counselor

## 2021-09-20 ENCOUNTER — Encounter: Payer: Self-pay | Admitting: *Deleted

## 2021-09-20 ENCOUNTER — Other Ambulatory Visit: Payer: Self-pay | Admitting: *Deleted

## 2021-09-20 DIAGNOSIS — Z1379 Encounter for other screening for genetic and chromosomal anomalies: Secondary | ICD-10-CM | POA: Insufficient documentation

## 2021-09-20 DIAGNOSIS — C50411 Malignant neoplasm of upper-outer quadrant of right female breast: Secondary | ICD-10-CM

## 2021-09-20 NOTE — Telephone Encounter (Signed)
I contacted [name] to discuss her genetic testing results. No pathogenic variants were identified in the 77 genes analyzed. Detailed clinic note to follow.  The test report has been scanned into EPIC and is located under the Molecular Pathology section of the Results Review tab.  A portion of the result report is included below for reference.   Kathryn Passy, MS, [facility] Genetic Counselor [email or web address] (P) [phone]

## 2021-09-20 NOTE — Telephone Encounter (Signed)
Called pt and discussed MRI denied again by [employer]. Pt relate has new palpable mass to right axilla that was not there 2 wks ago. Physician team notified. Orders placed for right US/bx.

## 2021-09-21 ENCOUNTER — Ambulatory Visit
Admission: RE | Admit: 2021-09-21 | Discharge: 2021-09-21 | Disposition: A | Discharge status: Home / Self Care | Payer: No Typology Code available for payment source | Source: Ambulatory Visit | Attending: General Surgery | Admitting: General Surgery

## 2021-09-21 ENCOUNTER — Other Ambulatory Visit: Payer: Self-pay

## 2021-09-21 ENCOUNTER — Other Ambulatory Visit: Payer: No Typology Code available for payment source

## 2021-09-21 DIAGNOSIS — Z170 Estrogen receptor positive status [ER+]: Secondary | ICD-10-CM

## 2021-09-21 DIAGNOSIS — C50411 Malignant neoplasm of upper-outer quadrant of right female breast: Secondary | ICD-10-CM

## 2021-09-21 MED ORDER — GADOBUTROL 1 MMOL/ML IV SOLN
6.0000 mL | Freq: Once | INTRAVENOUS | Status: AC | PRN
  Administered 2021-09-21: 6 mL via INTRAVENOUS

## 2021-09-22 ENCOUNTER — Other Ambulatory Visit: Payer: Self-pay | Admitting: Hematology and Oncology

## 2021-09-22 ENCOUNTER — Encounter: Payer: Self-pay | Admitting: *Deleted

## 2021-09-22 ENCOUNTER — Other Ambulatory Visit: Payer: Self-pay | Admitting: *Deleted

## 2021-09-22 DIAGNOSIS — C50411 Malignant neoplasm of upper-outer quadrant of right female breast: Secondary | ICD-10-CM

## 2021-09-22 DIAGNOSIS — R928 Other abnormal and inconclusive findings on diagnostic imaging of breast: Secondary | ICD-10-CM

## 2021-09-24 ENCOUNTER — Ambulatory Visit: Payer: Self-pay | Admitting: Genetic Counselor

## 2021-09-24 ENCOUNTER — Other Ambulatory Visit: Payer: Self-pay | Admitting: Hematology and Oncology

## 2021-09-24 DIAGNOSIS — C50411 Malignant neoplasm of upper-outer quadrant of right female breast: Secondary | ICD-10-CM

## 2021-09-24 DIAGNOSIS — R928 Other abnormal and inconclusive findings on diagnostic imaging of breast: Secondary | ICD-10-CM

## 2021-09-24 DIAGNOSIS — Z170 Estrogen receptor positive status [ER+]: Secondary | ICD-10-CM

## 2021-09-24 DIAGNOSIS — Z1379 Encounter for other screening for genetic and chromosomal anomalies: Secondary | ICD-10-CM

## 2021-09-24 NOTE — Progress Notes (Signed)
HPI:   ?Kathryn Mckenzie was previously seen in the [place] clinic due to a personal and family history of cancer and concerns regarding a hereditary predisposition to cancer. Please refer to our prior cancer genetics clinic note for more information regarding our discussion, assessment and recommendations, at the time. Kathryn Mckenzie's recent genetic test results were disclosed to her, as were recommendations warranted by these results. These results and recommendations are discussed in more detail below. ? ?CANCER HISTORY:  ?Oncology History  ?Malignant neoplasm of upper-outer quadrant of right breast in female, estrogen receptor positive ([place])  ?09/02/2021 Initial Diagnosis  ? Palpable right breast lump for 2 to 3 months UOQ.  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  ?09/08/2021 Cancer Staging  ? Staging form: Breast, AJCC 8th Edition ?- Clinical stage from 09/08/2021: Stage IA (cT1c, cN0, cM0, G1, ER+, PR+, HER2-) - Signed by Nicholas Lose, MD on 09/08/2021 ?Stage prefix: Initial diagnosis ?Histologic grading system: 3 grade system ? ?  ? Genetic Testing  ? [facility] CancerNext-Expanded is Negative. Report date is 09/17/2021. ? ?The CancerNext-Expanded gene panel offered by [facility] and includes sequencing, rearrangement, and RNA analysis for the following 77 genes: AIP, ALK, APC, ATM, AXIN2, BAP1, BARD1, BLM, BMPR1A, BRCA1, BRCA2, BRIP1, CDC73, CDH1, CDK4, CDKN1B, CDKN2A, CHEK2, CTNNA1, DICER1, FANCC, FH, FLCN, GALNT12, KIF1B, LZTR1, MAX, MEN1, MET, MLH1, MSH2, MSH3, MSH6, MUTYH, NBN, NF1, NF2, NTHL1, PALB2, PHOX2B, PMS2, POT1, PRKAR1A, PTCH1, PTEN, RAD51C, RAD51D, RB1, RECQL, RET, SDHA, SDHAF2, SDHB, SDHC, SDHD, SMAD4, SMARCA4, SMARCB1, SMARCE1, STK11, SUFU, TMEM127, TP53, TSC1, TSC2, VHL and XRCC2 (sequencing and deletion/duplication); EGFR, EGLN1, HOXB13, KIT, MITF, PDGFRA, POLD1, and POLE  (sequencing only); EPCAM and GREM1 (deletion/duplication only).  ?  ? ? ?FAMILY HISTORY:  ?We obtained a detailed, 4-generation family history.  Significant diagnoses are listed below: ?     ?Family History  ?Problem Relation Age of Onset  ? Melanoma Maternal Aunt    ? Melanoma Maternal Uncle    ? Breast cancer Maternal Grandmother 60  ? Pancreatic cancer Paternal Grandmother 84  ?  ?  ?  ?  ?Kathryn Mckenzie reports a maternal aunt and uncle diagnosed with melanoma. Her maternal grandmother was diagnosed with breast cancer at age 65 and died at age 82. Kathryn Mckenzie's paternal grandmother was diagnosed with pancreatic cancer at age 32, she died at 66. A paternal great aunt (grandfather's sister) was diagnosed with ovarian cancer, she is deceased. ?  ?Kathryn Mckenzie is unaware of previous family history of genetic testing for hereditary cancer risks. There is no reported Ashkenazi Jewish ancestry. ?  ?GENETIC TEST RESULTS:  ?The [facility] CancerNext-Expanded Panel found no pathogenic mutations.  ? ?The CancerNext-Expanded gene panel offered by [facility] and includes sequencing, rearrangement, and RNA analysis for the following 77 genes: AIP, ALK, APC, ATM, AXIN2, BAP1, BARD1, BLM, BMPR1A, BRCA1, BRCA2, BRIP1, CDC73, CDH1, CDK4, CDKN1B, CDKN2A, CHEK2, CTNNA1, DICER1, FANCC, FH, FLCN, GALNT12, KIF1B, LZTR1, MAX, MEN1, MET, MLH1, MSH2, MSH3, MSH6, MUTYH, NBN, NF1, NF2, NTHL1, PALB2, PHOX2B, PMS2, POT1, PRKAR1A, PTCH1, PTEN, RAD51C, RAD51D, RB1, RECQL, RET, SDHA, SDHAF2, SDHB, SDHC, SDHD, SMAD4, SMARCA4, SMARCB1, SMARCE1, STK11, SUFU, TMEM127, TP53, TSC1, TSC2, VHL and XRCC2 (sequencing and deletion/duplication); EGFR, EGLN1, HOXB13, KIT, MITF, PDGFRA, POLD1, and POLE (sequencing only); EPCAM and GREM1 (deletion/duplication only).   ? ?The test report has been scanned into EPIC  and is located under the Molecular Pathology section of the Results Review tab.  A portion of the result report is included below for reference. Genetic testing  reported out on 09/17/2021. ? ?  ? ? ? ? ? ?Even though a pathogenic variant was not identified, possible explanations for the cancer in the family may include: ?There may be no hereditary risk for cancer in the family. The cancers in Kathryn Mckenzie and/or her family may be due to other genetic or environmental factors. ?There may be a gene mutation in one of these genes that current testing methods cannot detect, but that chance is small. ?There could be another gene that has not yet been discovered, or that we have not yet tested, that is responsible for the cancer diagnoses in the family.  ?It is also possible there is a hereditary cause for the cancer in the family that Kathryn Mckenzie did not inherit. ? ?Therefore, it is important to remain in touch with cancer genetics in the future so that we can continue to offer Kathryn Mckenzie the most up to date genetic testing.  ? ?ADDITIONAL GENETIC TESTING:  ?We discussed with Kathryn Mckenzie that her genetic testing was fairly extensive.  If there are genes identified to increase cancer risk that can be analyzed in the future, we would be happy to discuss and coordinate this testing at that time.   ? ?CANCER SCREENING RECOMMENDATIONS:  ?Kathryn Mckenzie's test result is considered negative (normal).  This means that we have not identified a hereditary cause for her personal and family history of cancer at this time.  ? ?An individual's cancer risk and medical management are not determined by genetic test results alone. Overall cancer risk assessment incorporates additional factors, including personal medical history, family history, and any available genetic information that may result in a personalized plan for cancer prevention and surveillance. Therefore, it is recommended she continue to follow the cancer management and screening guidelines provided by her oncology and primary healthcare provider. ? ?RECOMMENDATIONS FOR FAMILY MEMBERS:   ?Since she did not inherit a mutation in a cancer predisposition  gene included on this panel, her children could not have inherited a mutation from her in one of these genes. ?Individuals in this family might be at some increased risk of developing cancer, over the general population risk, due to the family history of cancer. We recommend her daughter begin mammograms at age 45 (44 years prior to her age at diagnosis).   ?Other members of the family may still carry a pathogenic variant in one of these genes that Kathryn Mckenzie did not inherit. Based on the family history, we recommend her father have genetic counseling and testing.  ? ?FOLLOW-UP:  ?Cancer genetics is a rapidly advancing field and it is possible that new genetic tests will be appropriate for her and/or her family members in the future. We encouraged her to remain in contact with cancer genetics on an annual basis so we can update her personal and family histories and let her know of advances in cancer genetics that may benefit this family.  ? ?Our contact number was provided. Kathryn Mckenzie's questions were answered to her satisfaction, and she knows she is welcome to call us at anytime with additional questions or concerns.  ? ?Lucille Passy, MS, [place] ?Genetic Counselor ?[email or web address] ?(P) [phone] ? ? ?

## 2021-09-27 ENCOUNTER — Other Ambulatory Visit: Payer: Self-pay

## 2021-09-27 ENCOUNTER — Ambulatory Visit: Payer: No Typology Code available for payment source

## 2021-09-27 ENCOUNTER — Ambulatory Visit
Admission: RE | Admit: 2021-09-27 | Discharge: 2021-09-27 | Disposition: A | Discharge status: Home / Self Care | Payer: No Typology Code available for payment source | Source: Ambulatory Visit | Attending: Hematology and Oncology | Admitting: Hematology and Oncology

## 2021-09-27 DIAGNOSIS — R928 Other abnormal and inconclusive findings on diagnostic imaging of breast: Secondary | ICD-10-CM

## 2021-09-27 DIAGNOSIS — C50411 Malignant neoplasm of upper-outer quadrant of right female breast: Secondary | ICD-10-CM

## 2021-09-28 ENCOUNTER — Other Ambulatory Visit: Payer: No Typology Code available for payment source

## 2021-09-28 ENCOUNTER — Inpatient Hospital Stay: Payer: No Typology Code available for payment source | Attending: Hematology and Oncology

## 2021-09-28 ENCOUNTER — Encounter: Payer: Self-pay | Admitting: *Deleted

## 2021-09-28 DIAGNOSIS — Z170 Estrogen receptor positive status [ER+]: Secondary | ICD-10-CM

## 2021-09-28 DIAGNOSIS — C50411 Malignant neoplasm of upper-outer quadrant of right female breast: Secondary | ICD-10-CM

## 2021-09-28 LAB — RESEARCH LABS

## 2021-09-28 NOTE — Research (Addendum)
[employer] 2021-05 - Specimen Collection Study to Evaluate Biomarkers in Subjects with Cancer  ? ?Met with patient this am and confirmed she can read, write, and understand English. Has not had biopsy in last 7 days, no IV contrast within the last 24 hours, and no hx of cancer or cancer tx. ? ?Also confirmed no high BP, CAD, lupus, RA, DM, or Lynch Syndrome. ? ?Not taking magnesium supplement. ? ?Family hx ca: maternal grandmother (breast), paternal grandmother (pancreatic). ? ?Has 1 alcoholic drink per week. ? ?Never smoked or used tobacco products. ? ?After full review of eligibility & ineligibilty criteria, pt is eligible to enroll. ? ?After blood collection, pt was given & signed for Visa card ending in [id number]. ? ?Melanie G. Henslee, RN, BSN, CHPN ?She  Her  Hers ?Clinical Research Nurse ?[facility] ?Direct Dial [phone]  Pager [phone] ?09/28/2021 9:11 AM ?

## 2021-09-28 NOTE — Research (Signed)
[employer] 2021-05 - Specimen Collection Study to Evaluate Biomarkers in Subjects with Cancer  ? ?09/28/21  ? ?This Coordinator has reviewed this patient's inclusion and exclusion criteria and confirmed Kathryn Mckenzie is eligible for study participation.  Patient will continue with enrollment. ? ?Clabe Seal ?Clinical Research Coordinator I  ?09/28/21 10:06 AM ? ?

## 2021-09-28 NOTE — Research (Addendum)
Trial Name:  [employer] 2021-05 - Specimen Collection Study to Evaluate Biomarkers in Subjects with Cancer  ? ? ?Patient Kathryn Mckenzie was identified by Dr Lindi Adie as a potential candidate for the above listed study.  This Clinical Research Nurse met with Kathryn Mckenzie, [id number] on 09/28/21 in a manner and location that ensures patient privacy to discuss participation in the above listed research study.  Patient is Unaccompanied.  Patient was previously provided with informed consent documents.  Patient confirmed they have read the informed consent documents. ? ?As outlined in the informed consent form, this Nurse and Brock Ra Cassity discussed the purpose of the research study, the investigational nature of the study, study procedures and requirements for study participation, potential risks and benefits of study participation, as well as alternatives to participation.  This study is not blinded or double-blinded. The patient understands participation is voluntary and they may withdraw from study participation at any time.  This study does not involve randomization.  This study does not involve an investigational drug or device. This study does not involve a placebo. Patient understands enrollment is pending full eligibility review.  ? ?Confidentiality and how the patient's information will be used as part of study participation were discussed.  Patient was informed there is reimbursement provided for their time and effort spent on trial participation.  The patient is encouraged to discuss research study participation with their insurance provider to determine what costs they may incur as part of study participation, including research related injury.   ? ?All questions were answered to patient's satisfaction.  The informed consent with embedded HIPAA language was reviewed page by page.  The patient's mental and emotional status is appropriate to provide informed consent, and the patient verbalizes an understanding of  study participation.  Patient has agreed to participate in the above listed research study and has voluntarily signed the informed consent version 14 with embedded HIPAA language, version 14  on 09/28/21 at 0815AM.  The patient was provided with a copy of the signed informed consent form with embedded HIPAA language for their reference.  No study specific procedures were obtained prior to the signing of the informed consent document.  Approximately 15 minutes were spent with the patient reviewing the informed consent documents.  Patient was not requested to complete a Release of Information form. ? ?Marjie Skiff. Juma Oxley, RN, BSN, CHPN ?She  [facility]  [facility] ?Clinical Research Nurse ?[place] ?Direct Dial [phone]  Pager [phone] ?09/28/2021 9:07 AM ?

## 2021-09-30 ENCOUNTER — Other Ambulatory Visit: Payer: Self-pay

## 2021-09-30 ENCOUNTER — Other Ambulatory Visit: Payer: Self-pay | Admitting: Hematology and Oncology

## 2021-09-30 ENCOUNTER — Ambulatory Visit
Admission: RE | Admit: 2021-09-30 | Discharge: 2021-09-30 | Disposition: A | Discharge status: Home / Self Care | Payer: No Typology Code available for payment source | Source: Ambulatory Visit | Attending: Adult Health | Admitting: Adult Health

## 2021-09-30 ENCOUNTER — Ambulatory Visit
Admission: RE | Admit: 2021-09-30 | Discharge: 2021-09-30 | Disposition: A | Discharge status: Home / Self Care | Payer: No Typology Code available for payment source | Source: Ambulatory Visit | Attending: Hematology and Oncology | Admitting: Hematology and Oncology

## 2021-09-30 DIAGNOSIS — R928 Other abnormal and inconclusive findings on diagnostic imaging of breast: Secondary | ICD-10-CM

## 2021-09-30 DIAGNOSIS — C50411 Malignant neoplasm of upper-outer quadrant of right female breast: Secondary | ICD-10-CM

## 2021-09-30 DIAGNOSIS — Z170 Estrogen receptor positive status [ER+]: Secondary | ICD-10-CM

## 2021-09-30 MED ORDER — GADOBUTROL 1 MMOL/ML IV SOLN
5.0000 mL | Freq: Once | INTRAVENOUS | Status: AC | PRN
  Administered 2021-09-30: 5 mL via INTRAVENOUS

## 2021-10-04 ENCOUNTER — Encounter: Payer: Self-pay | Admitting: *Deleted

## 2021-10-05 ENCOUNTER — Ambulatory Visit
Admission: RE | Admit: 2021-10-05 | Discharge: 2021-10-05 | Disposition: A | Discharge status: Home / Self Care | Payer: No Typology Code available for payment source | Source: Ambulatory Visit | Attending: Adult Health | Admitting: Adult Health

## 2021-10-05 ENCOUNTER — Other Ambulatory Visit: Payer: Self-pay | Admitting: Hematology and Oncology

## 2021-10-05 ENCOUNTER — Ambulatory Visit: Payer: Self-pay | Admitting: General Surgery

## 2021-10-05 ENCOUNTER — Other Ambulatory Visit: Payer: Self-pay | Admitting: General Surgery

## 2021-10-05 DIAGNOSIS — C50411 Malignant neoplasm of upper-outer quadrant of right female breast: Secondary | ICD-10-CM

## 2021-10-06 ENCOUNTER — Other Ambulatory Visit: Payer: Self-pay | Admitting: *Deleted

## 2021-10-06 ENCOUNTER — Other Ambulatory Visit: Payer: No Typology Code available for payment source

## 2021-10-06 ENCOUNTER — Encounter: Payer: Self-pay | Admitting: *Deleted

## 2021-10-06 ENCOUNTER — Encounter: Payer: No Typology Code available for payment source | Admitting: Genetic Counselor

## 2021-10-07 ENCOUNTER — Encounter: Payer: Self-pay | Admitting: *Deleted

## 2021-11-04 ENCOUNTER — Other Ambulatory Visit: Payer: Self-pay

## 2021-11-04 ENCOUNTER — Encounter (HOSPITAL_BASED_OUTPATIENT_CLINIC_OR_DEPARTMENT_OTHER): Payer: Self-pay | Admitting: General Surgery

## 2021-11-08 ENCOUNTER — Encounter: Payer: Self-pay | Admitting: Genetic Counselor

## 2021-11-09 ENCOUNTER — Ambulatory Visit
Admission: RE | Admit: 2021-11-09 | Discharge: 2021-11-09 | Disposition: A | Discharge status: Home / Self Care | Payer: No Typology Code available for payment source | Source: Ambulatory Visit | Attending: General Surgery | Admitting: General Surgery

## 2021-11-09 ENCOUNTER — Other Ambulatory Visit: Payer: Self-pay | Admitting: General Surgery

## 2021-11-09 DIAGNOSIS — Z170 Estrogen receptor positive status [ER+]: Secondary | ICD-10-CM

## 2021-11-09 NOTE — Progress Notes (Signed)

## 2021-11-10 ENCOUNTER — Other Ambulatory Visit: Payer: Self-pay

## 2021-11-10 ENCOUNTER — Ambulatory Visit
Admission: RE | Admit: 2021-11-10 | Discharge: 2021-11-10 | Disposition: A | Discharge status: Home / Self Care | Payer: No Typology Code available for payment source | Source: Ambulatory Visit | Attending: General Surgery | Admitting: General Surgery

## 2021-11-10 ENCOUNTER — Encounter (HOSPITAL_BASED_OUTPATIENT_CLINIC_OR_DEPARTMENT_OTHER): Payer: Self-pay | Admitting: General Surgery

## 2021-11-10 ENCOUNTER — Ambulatory Visit (HOSPITAL_BASED_OUTPATIENT_CLINIC_OR_DEPARTMENT_OTHER): Payer: No Typology Code available for payment source | Admitting: Anesthesiology

## 2021-11-10 ENCOUNTER — Encounter (HOSPITAL_BASED_OUTPATIENT_CLINIC_OR_DEPARTMENT_OTHER)
Admission: RE | Disposition: A | Discharge status: Home / Self Care | Payer: Self-pay | Source: Home / Self Care | Attending: General Surgery

## 2021-11-10 ENCOUNTER — Ambulatory Visit (HOSPITAL_BASED_OUTPATIENT_CLINIC_OR_DEPARTMENT_OTHER)
Admission: RE | Admit: 2021-11-10 | Discharge: 2021-11-10 | Disposition: A | Discharge status: Home / Self Care | Payer: No Typology Code available for payment source | Attending: General Surgery | Admitting: General Surgery

## 2021-11-10 DIAGNOSIS — C50911 Malignant neoplasm of unspecified site of right female breast: Secondary | ICD-10-CM

## 2021-11-10 DIAGNOSIS — Z800 Family history of malignant neoplasm of digestive organs: Secondary | ICD-10-CM | POA: Diagnosis not present

## 2021-11-10 DIAGNOSIS — Z170 Estrogen receptor positive status [ER+]: Secondary | ICD-10-CM

## 2021-11-10 DIAGNOSIS — C50411 Malignant neoplasm of upper-outer quadrant of right female breast: Secondary | ICD-10-CM | POA: Insufficient documentation

## 2021-11-10 DIAGNOSIS — Z803 Family history of malignant neoplasm of breast: Secondary | ICD-10-CM | POA: Diagnosis not present

## 2021-11-10 DIAGNOSIS — F419 Anxiety disorder, unspecified: Secondary | ICD-10-CM

## 2021-11-10 HISTORY — DX: Other specified postprocedural states: Z98.890

## 2021-11-10 HISTORY — PX: BREAST LUMPECTOMY WITH RADIOACTIVE SEED AND SENTINEL LYMPH NODE BIOPSY: SHX6550

## 2021-11-10 HISTORY — DX: Anxiety disorder, unspecified: F41.9

## 2021-11-10 LAB — POCT PREGNANCY, URINE: Preg Test, Ur: NEGATIVE

## 2021-11-10 SURGERY — BREAST LUMPECTOMY WITH RADIOACTIVE SEED AND SENTINEL LYMPH NODE BIOPSY
Anesthesia: General | Site: Breast | Laterality: Right

## 2021-11-10 MED ORDER — ACETAMINOPHEN 500 MG PO TABS
1000.0000 mg | ORAL_TABLET | ORAL | Status: AC
  Administered 2021-11-10: 1000 mg via ORAL

## 2021-11-10 MED ORDER — MIDAZOLAM HCL 2 MG/2ML IJ SOLN
INTRAMUSCULAR | Status: AC
  Filled 2021-11-10: qty 2

## 2021-11-10 MED ORDER — MIDAZOLAM HCL 2 MG/2ML IJ SOLN
2.0000 mg | Freq: Once | INTRAMUSCULAR | Status: AC
  Administered 2021-11-10: 2 mg via INTRAVENOUS

## 2021-11-10 MED ORDER — ONDANSETRON HCL 4 MG/2ML IJ SOLN
INTRAMUSCULAR | Status: AC
  Filled 2021-11-10: qty 2

## 2021-11-10 MED ORDER — BUPIVACAINE-EPINEPHRINE 0.25% -1:200000 IJ SOLN
INTRAMUSCULAR | Status: DC | PRN
  Administered 2021-11-10: 10 mL

## 2021-11-10 MED ORDER — LIDOCAINE 2% (20 MG/ML) 5 ML SYRINGE
INTRAMUSCULAR | Status: AC
  Filled 2021-11-10: qty 5

## 2021-11-10 MED ORDER — MEPERIDINE HCL 25 MG/ML IJ SOLN: 6.2500 mg | INTRAMUSCULAR | Status: DC | PRN

## 2021-11-10 MED ORDER — GABAPENTIN 300 MG PO CAPS
300.0000 mg | ORAL_CAPSULE | ORAL | Status: AC
  Administered 2021-11-10: 300 mg via ORAL

## 2021-11-10 MED ORDER — FENTANYL CITRATE (PF) 100 MCG/2ML IJ SOLN
INTRAMUSCULAR | Status: AC
Start: 2021-11-10 — End: ?
  Filled 2021-11-10: qty 2

## 2021-11-10 MED ORDER — GABAPENTIN 300 MG PO CAPS
ORAL_CAPSULE | ORAL | Status: AC
  Filled 2021-11-10: qty 1

## 2021-11-10 MED ORDER — MAGTRACE LYMPHATIC TRACER
INTRAMUSCULAR | Status: DC | PRN
  Administered 2021-11-10: 2 mL via INTRAMUSCULAR

## 2021-11-10 MED ORDER — SUCCINYLCHOLINE CHLORIDE 200 MG/10ML IV SOSY
PREFILLED_SYRINGE | INTRAVENOUS | Status: AC
  Filled 2021-11-10: qty 10

## 2021-11-10 MED ORDER — EPHEDRINE SULFATE (PRESSORS) 50 MG/ML IJ SOLN
INTRAMUSCULAR | Status: DC | PRN
  Administered 2021-11-10: 5 mg via INTRAVENOUS
  Administered 2021-11-10: 20 mg via INTRAVENOUS

## 2021-11-10 MED ORDER — FENTANYL CITRATE (PF) 100 MCG/2ML IJ SOLN
INTRAMUSCULAR | Status: AC
  Filled 2021-11-10: qty 2

## 2021-11-10 MED ORDER — ONDANSETRON HCL 4 MG/2ML IJ SOLN
INTRAMUSCULAR | Status: DC | PRN
  Administered 2021-11-10: 4 mg via INTRAVENOUS

## 2021-11-10 MED ORDER — FENTANYL CITRATE (PF) 100 MCG/2ML IJ SOLN
100.0000 ug | Freq: Once | INTRAMUSCULAR | Status: AC
  Administered 2021-11-10: 100 ug via INTRAVENOUS

## 2021-11-10 MED ORDER — DEXAMETHASONE SODIUM PHOSPHATE 4 MG/ML IJ SOLN
INTRAMUSCULAR | Status: DC | PRN
  Administered 2021-11-10: 5 mg via INTRAVENOUS

## 2021-11-10 MED ORDER — PROPOFOL 10 MG/ML IV BOLUS
INTRAVENOUS | Status: DC | PRN
  Administered 2021-11-10: 200 mg via INTRAVENOUS

## 2021-11-10 MED ORDER — EPHEDRINE 5 MG/ML INJ
INTRAVENOUS | Status: AC
Start: 2021-11-10 — End: ?
  Filled 2021-11-10: qty 5

## 2021-11-10 MED ORDER — PROMETHAZINE HCL 25 MG/ML IJ SOLN: 6.2500 mg | INTRAMUSCULAR | Status: DC | PRN

## 2021-11-10 MED ORDER — OXYCODONE HCL 5 MG PO TABS: 5.0000 mg | ORAL_TABLET | Freq: Once | ORAL | Status: DC | PRN

## 2021-11-10 MED ORDER — CHLORHEXIDINE GLUCONATE CLOTH 2 % EX PADS: 6.0000 | MEDICATED_PAD | Freq: Once | CUTANEOUS | Status: DC

## 2021-11-10 MED ORDER — ACETAMINOPHEN 500 MG PO TABS
ORAL_TABLET | ORAL | Status: AC
  Filled 2021-11-10: qty 2

## 2021-11-10 MED ORDER — LIDOCAINE HCL (CARDIAC) PF 100 MG/5ML IV SOSY
PREFILLED_SYRINGE | INTRAVENOUS | Status: DC | PRN
  Administered 2021-11-10: 60 mg via INTRAVENOUS

## 2021-11-10 MED ORDER — CELECOXIB 200 MG PO CAPS
200.0000 mg | ORAL_CAPSULE | ORAL | Status: AC
  Administered 2021-11-10: 200 mg via ORAL

## 2021-11-10 MED ORDER — CELECOXIB 200 MG PO CAPS
ORAL_CAPSULE | ORAL | Status: AC
  Filled 2021-11-10: qty 1

## 2021-11-10 MED ORDER — DEXAMETHASONE SODIUM PHOSPHATE 10 MG/ML IJ SOLN
INTRAMUSCULAR | Status: AC
  Filled 2021-11-10: qty 1

## 2021-11-10 MED ORDER — HYDROMORPHONE HCL 1 MG/ML IJ SOLN: 0.2500 mg | INTRAMUSCULAR | Status: DC | PRN

## 2021-11-10 MED ORDER — LACTATED RINGERS IV SOLN: INTRAVENOUS | Status: DC

## 2021-11-10 MED ORDER — ATROPINE SULFATE 0.4 MG/ML IV SOLN
INTRAVENOUS | Status: AC
  Filled 2021-11-10: qty 1

## 2021-11-10 MED ORDER — AMISULPRIDE (ANTIEMETIC) 5 MG/2ML IV SOLN: 10.0000 mg | Freq: Once | INTRAVENOUS | Status: DC | PRN

## 2021-11-10 MED ORDER — CEFAZOLIN SODIUM-DEXTROSE 2-4 GM/100ML-% IV SOLN
INTRAVENOUS | Status: AC
  Filled 2021-11-10: qty 100

## 2021-11-10 MED ORDER — ROPIVACAINE HCL 5 MG/ML IJ SOLN
INTRAMUSCULAR | Status: DC | PRN
  Administered 2021-11-10: 30 mL via PERINEURAL

## 2021-11-10 MED ORDER — OXYCODONE HCL 5 MG/5ML PO SOLN: 5.0000 mg | Freq: Once | ORAL | Status: DC | PRN

## 2021-11-10 MED ORDER — CEFAZOLIN SODIUM-DEXTROSE 2-4 GM/100ML-% IV SOLN
2.0000 g | INTRAVENOUS | Status: AC
  Administered 2021-11-10: 2 g via INTRAVENOUS

## 2021-11-10 MED ORDER — PHENYLEPHRINE 80 MCG/ML (10ML) SYRINGE FOR IV PUSH (FOR BLOOD PRESSURE SUPPORT)
PREFILLED_SYRINGE | INTRAVENOUS | Status: AC
  Filled 2021-11-10: qty 10

## 2021-11-10 MED ORDER — OXYCODONE HCL 5 MG PO TABS: 5.0000 mg | ORAL_TABLET | Freq: Four times a day (QID) | ORAL | 0 refills | Status: DC | PRN

## 2021-11-10 SURGICAL SUPPLY — 38 items
ADH SKN CLS APL DERMABOND .7 (GAUZE/BANDAGES/DRESSINGS) ×1
APL PRP STRL LF DISP 70% ISPRP (MISCELLANEOUS) ×1
APPLIER CLIP 9.375 MED OPEN (MISCELLANEOUS) ×2
APR CLP MED 9.3 20 MLT OPN (MISCELLANEOUS) ×1
BLADE SURG 15 STRL LF DISP TIS (BLADE) ×1 IMPLANT
BLADE SURG 15 STRL SS (BLADE) ×2
CANISTER SUCT 1200ML W/VALVE (MISCELLANEOUS) ×1 IMPLANT
CHLORAPREP W/TINT 26 (MISCELLANEOUS) ×2 IMPLANT
CLIP APPLIE 9.375 MED OPEN (MISCELLANEOUS) ×1 IMPLANT
COVER BACK TABLE 60X90IN (DRAPES) ×2 IMPLANT
COVER MAYO STAND STRL (DRAPES) ×2 IMPLANT
COVER PROBE W GEL 5X96 (DRAPES) ×3 IMPLANT
DERMABOND ADVANCED (GAUZE/BANDAGES/DRESSINGS) ×1
DERMABOND ADVANCED .7 DNX12 (GAUZE/BANDAGES/DRESSINGS) ×1 IMPLANT
DRAPE LAPAROSCOPIC ABDOMINAL (DRAPES) ×2 IMPLANT
DRAPE UTILITY XL STRL (DRAPES) ×2 IMPLANT
ELECT COATED BLADE 2.86 ST (ELECTRODE) ×2 IMPLANT
ELECT REM PT RETURN 9FT ADLT (ELECTROSURGICAL) ×2
ELECTRODE REM PT RTRN 9FT ADLT (ELECTROSURGICAL) ×1 IMPLANT
GLOVE BIO SURGEON STRL SZ7.5 (GLOVE) ×2 IMPLANT
GOWN STRL REUS W/ TWL LRG LVL3 (GOWN DISPOSABLE) ×2 IMPLANT
GOWN STRL REUS W/TWL LRG LVL3 (GOWN DISPOSABLE) ×4
KIT MARKER MARGIN INK (KITS) ×2 IMPLANT
NDL HYPO 25X1 1.5 SAFETY (NEEDLE) ×1 IMPLANT
NEEDLE HYPO 25X1 1.5 SAFETY (NEEDLE) ×4 IMPLANT
NS IRRIG 1000ML POUR BTL (IV SOLUTION) ×1 IMPLANT
PACK BASIN DAY SURGERY FS (CUSTOM PROCEDURE TRAY) ×2 IMPLANT
PENCIL SMOKE EVACUATOR (MISCELLANEOUS) ×2 IMPLANT
SLEEVE SCD COMPRESS KNEE MED (STOCKING) ×2 IMPLANT
SPONGE T-LAP 18X18 ~~LOC~~+RFID (SPONGE) ×2 IMPLANT
SUT MON AB 4-0 PC3 18 (SUTURE) ×4 IMPLANT
SUT VICRYL 3-0 CR8 SH (SUTURE) ×2 IMPLANT
SYR CONTROL 10ML LL (SYRINGE) ×2 IMPLANT
TOWEL GREEN STERILE FF (TOWEL DISPOSABLE) ×2 IMPLANT
TRACER MAGTRACE VIAL (MISCELLANEOUS) ×1 IMPLANT
TRAY FAXITRON CT DISP (TRAY / TRAY PROCEDURE) ×2 IMPLANT
TUBE CONNECTING 20X1/4 (TUBING) ×1 IMPLANT
YANKAUER SUCT BULB TIP NO VENT (SUCTIONS) ×1 IMPLANT

## 2021-11-10 NOTE — Anesthesia Procedure Notes (Signed)
Procedure Name: LMA Insertion ?Date/Time: 11/10/2021 9:48 AM ?Performed by: Willa Frater, CRNA ?Pre-anesthesia Checklist: Patient identified, Emergency Drugs available, Suction available and Patient being monitored ?Patient Re-evaluated:Patient Re-evaluated prior to induction ?Oxygen Delivery Method: Circle system utilized ?Preoxygenation: Pre-oxygenation with 100% oxygen ?Induction Type: IV induction ?Ventilation: Mask ventilation without difficulty ?LMA: LMA inserted ?LMA Size: 3.0 ?Number of attempts: 1 ?Airway Equipment and Method: Bite block ?Placement Confirmation: positive ETCO2 ?Tube secured with: Tape ?Dental Injury: Teeth and Oropharynx as per pre-operative assessment  ? ? ? ? ?

## 2021-11-10 NOTE — Interval H&P Note (Signed)
History and Physical Interval Note: ? ?11/10/2021 ?8:20 AM ? ?Kathryn Mckenzie  has presented today for surgery, with the diagnosis of RIGHT BREAST CANCER.  The various methods of treatment have been discussed with the patient and family. After consideration of risks, benefits and other options for treatment, the patient has consented to  Procedure(s): ?RIGHT BREAST LUMPECTOMY WITH RADIOACTIVE SEED AND SENTINEL LYMPH NODE BIOPSY (Right) as a surgical intervention.  The patient's history has been reviewed, patient examined, no change in status, stable for surgery.  I have reviewed the patient's chart and labs.  Questions were answered to the patient's satisfaction.   ? ? ?Kathryn Mckenzie ? ? ?

## 2021-11-10 NOTE — Discharge Instructions (Addendum)
?  Post Anesthesia Home Care Instructions ? ?Activity: ?Get plenty of rest for the remainder of the day. A responsible individual must stay with you for 24 hours following the procedure.  ?For the next 24 hours, DO NOT: ?-Drive a car ?-Paediatric nurse ?-Drink alcoholic beverages ?-Take any medication unless instructed by your physician ?-Make any legal decisions or sign important papers. ? ?Meals: ?Start with liquid foods such as gelatin or soup. Progress to regular foods as tolerated. Avoid greasy, spicy, heavy foods. If nausea and/or vomiting occur, drink only clear liquids until the nausea and/or vomiting subsides. Call your physician if vomiting continues. ? ?Special Instructions/Symptoms: ?Your throat may feel dry or sore from the anesthesia or the breathing tube placed in your throat during surgery. If this causes discomfort, gargle with warm salt water. The discomfort should disappear within 24 hours. ? ?If you had a scopolamine patch placed behind your ear for the management of post- operative nausea and/or vomiting: ? ?1. The medication in the patch is effective for 72 hours, after which it should be removed.  Wrap patch in a tissue and discard in the trash. Wash hands thoroughly with soap and water. ?2. You may remove the patch earlier than 72 hours if you experience unpleasant side effects which may include dry mouth, dizziness or visual disturbances. ?3. Avoid touching the patch. Wash your hands with soap and water after contact with the patch. ?    ? ?Next dose of Tylenol can be given after 2:22 PM. ?Next dose of NSAID (Ibuprofen, Motrin, Aleve) can be given after 2:22 PM. ? ?

## 2021-11-10 NOTE — H&P (Signed)
?REFERRING PHYSICIAN: Self ? ?PROVIDER: Landry Corporal, MD ? ?MRN: [id number] ?DOB: [birth year] ?Subjective  ? ?Chief Complaint: Breast Cancer ? ? ?History of Present Illness: ?Kathryn Mckenzie is a 41 y.o. female who is seen today as an office consultation at the request of Dr. Pasty Arch for evaluation of Breast Cancer ?.  ? ?We are asked to see the patient in consultation by Dr. Thedora Hinders to evaluate her for a new right breast cancer. The patient is a 41 year old white female who first felt a lump in the upper outer quadrant of the right breast in October. She has very dense breast tissue and has difficulty feeling masses. She has had cysts in the past. She brought this to her doctor's attention where a work-up was performed. On ultrasound she was found to have a 1.2 cm mass in this area. The lymph nodes looked normal. 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 She has a family history of breast cancer in her maternal grandmother and pancreatic cancer in a paternal grandmother. She is otherwise in good health and does not smoke. ? ?Review of Systems: ?A complete review of systems was obtained from the patient. I have reviewed this information and discussed as appropriate with the patient. See HPI as well for other ROS. ? ?ROS  ? ?Medical History: ?Past Medical History:  ?Diagnosis Date  ? Anxiety  ? ?Patient Active Problem List  ?Diagnosis  ? Malignant neoplasm of upper-outer quadrant of right breast in female, estrogen receptor positive (CMS-HCC)  ? Anxiety  ? Basal cell carcinoma of skin  ? Raynaud's phenomenon  ? Hypothyroidism  ? Primary insomnia  ? Breast lump  ? Other specified abnormal findings of blood chemistry  ? Nervousness  ? ?Past Surgical History:  ?Procedure Laterality Date  ? CESAREAN SECTION N/A  ?Unknown date  ? TONSILLECTOMY N/A  ?Unknown date  ? ? ?No Known Allergies ? ?Current Outpatient Medications on File  Prior to Visit  ?Medication Sig Dispense Refill  ? sertraline (ZOLOFT) 25 MG tablet Take 25 mg by mouth once daily  ? ?No current facility-administered medications on file prior to visit.  ? ?Family History  ?Problem Relation Age of Onset  ? Hyperlipidemia (Elevated cholesterol) Mother  ? Breast cancer Maternal Grandmother  ? Diabetes Paternal Grandmother  ? Pancreatic cancer Paternal Grandmother  ? Diabetes Paternal Grandfather  ? Coronary Artery Disease (Blocked arteries around heart) Paternal Grandfather  ? Skin cancer Paternal Grandfather  ? ? ?Social History  ? ?Tobacco Use  ?Smoking Status Never  ?Smokeless Tobacco Never  ? ? ?Social History  ? ?Socioeconomic History  ? Marital status: Married  ?Tobacco Use  ? Smoking status: Never  ? Smokeless tobacco: Never  ?Vaping Use  ? Vaping Use: Never used  ?Substance and Sexual Activity  ? Alcohol use: Yes  ?Comment: occasionally  ? Drug use: Never  ? ?Objective:  ?There were no vitals filed for this visit.  ?There is no height or weight on file to calculate BMI. ? ?Physical Exam ?Vitals reviewed.  ?Constitutional:  ?General: She is not in acute distress. ?Appearance: Normal appearance.  ?HENT:  ?Head: Normocephalic and atraumatic.  ?Right Ear: External ear normal.  ?Left Ear: External ear normal.  ?Nose: Nose normal.  ?Mouth/Throat:  ?Mouth: Mucous membranes are moist.  ?Pharynx: Oropharynx is clear.  ?Eyes:  ?General: No scleral icterus. ?Extraocular Movements: Extraocular  movements intact.  ?Conjunctiva/sclera: Conjunctivae normal.  ?Pupils: Pupils are equal, round, and reactive to light.  ?Cardiovascular:  ?Rate and Rhythm: Normal rate and regular rhythm.  ?Pulses: Normal pulses.  ?Heart sounds: Normal heart sounds.  ?Pulmonary:  ?Effort: Pulmonary effort is normal. No respiratory distress.  ?Breath sounds: Normal breath sounds.  ?Abdominal:  ?General: Bowel sounds are normal.  ?Palpations: Abdomen is soft.  ?Tenderness: There is no abdominal tenderness.   ?Musculoskeletal:  ?General: No swelling, tenderness or deformity. Normal range of motion.  ?Cervical back: Normal range of motion and neck supple.  ?Skin: ?General: Skin is warm and dry.  ?Coloration: Skin is not jaundiced.  ?Neurological:  ?General: No focal deficit present.  ?Mental Status: She is alert and oriented to person, place, and time.  ?Psychiatric:  ?Mood and Affect: Mood normal.  ?Behavior: Behavior normal.  ? ? ? ?Breast: There is symmetric very dense breast tissue bilaterally. It is difficult to palpate a discrete mass because of the density. There is 1 small mobile lymph node palpable in each axilla ? ?Labs, Imaging and Diagnostic Testing: ? ?Assessment and Plan:  ? ?Diagnoses and all orders for this visit: ? ?Malignant neoplasm of upper-outer quadrant of right breast in female, estrogen receptor positive (CMS-HCC) ?- Ambulatory Referral to Oncology-Medical ?- Ambulatory Referral to Radiation Oncology ?- Ambulatory Referral to Genetics ?- Ambulatory Referral to Plastic Surgery ?- [facility] Case Posting Request; Future ? ? ? ?The patient appears to have a 1.2 cm cancer in the upper outer quadrant of the right breast with clinically negative nodes. I have discussed with her in detail the different options for treatment and at this point she is undecided between lumpectomy versus bilateral mastectomies with reconstruction. Either way she will be a good candidate for sentinel node biopsy. I will refer her to plastic surgery to talk about the reconstructive options. She will undergo genetic testing which may help her make a decision. She will notify me once she has decided on her definitive surgery and we can then begin planning she will also meet with medical and radiation oncology to discuss adjuvant therapy. Given her breast density we will also obtain an MRI study to make sure we have an accurate estimate of the size and to make sure there is nothing else going on in these areas of density.  ? ?She has  elected for breast conservation. Risks and benefits of the surgery discussed with pt and she understands and wishes to proceed ?

## 2021-11-10 NOTE — Progress Notes (Signed)
Assisted Dr. Miller with right, pectoralis, ultrasound guided block. Side rails up, monitors on throughout procedure. See vital signs in flow sheet. Tolerated Procedure well. 

## 2021-11-10 NOTE — Anesthesia Procedure Notes (Signed)
Anesthesia Regional Block: Pectoralis block  ? ?Pre-Anesthetic Checklist: , timeout performed,  Correct Patient, Correct Site, Correct Laterality,  Correct Procedure, Correct Position, site marked,  Risks and benefits discussed,  Surgical consent,  Pre-op evaluation,  At surgeon's request and post-op pain management ? ?Laterality: Right ? ?Prep: chloraprep     ?  ?Needles:  ?Injection technique: Single-shot ? ?Needle Type: Stimiplex   ? ? ?Needle Length: 9cm  ?Needle Gauge: 21  ? ? ? ?Additional Needles: ? ? ?Procedures:,,,, ultrasound used (permanent image in chart),,    ?Narrative:  ?Start time: 11/10/2021 9:36 AM ?End time: 11/10/2021 9:41 AM ?Injection made incrementally with aspirations every 5 mL. ? ?Performed by: Personally  ?Anesthesiologist: Lynda Rainwater, MD ? ? ? ? ?

## 2021-11-10 NOTE — Anesthesia Postprocedure Evaluation (Signed)
Anesthesia Post Note ? ?Patient: Kathryn Mckenzie ? ?Procedure(s) Performed: RIGHT BREAST LUMPECTOMY WITH RADIOACTIVE SEED AND SENTINEL LYMPH NODE BIOPSY (Right: Breast) ? ?  ? ?Patient location during evaluation: PACU ?Anesthesia Type: General ?Level of consciousness: awake and alert ?Pain management: pain level controlled ?Vital Signs Assessment: post-procedure vital signs reviewed and stable ?Respiratory status: spontaneous breathing, nonlabored ventilation and respiratory function stable ?Cardiovascular status: blood pressure returned to baseline and stable ?Postop Assessment: no apparent nausea or vomiting ?Anesthetic complications: no ? ? ?No notable events documented. ? ?Last Vitals:  ?Vitals:  ? 11/10/21 1130 11/10/21 1201  ?BP: 117/76 122/75  ?Pulse: 74 76  ?Resp: 14 20  ?Temp:  37.2 ?C  ?SpO2: 97% 100%  ?  ?Last Pain:  ?Vitals:  ? 11/10/21 1201  ?TempSrc: Oral  ?PainSc: 3   ? ? ?  ?  ?  ?  ?  ?  ? ?Lynda Rainwater ? ? ? ? ?

## 2021-11-10 NOTE — Anesthesia Preprocedure Evaluation (Signed)
Anesthesia Evaluation  ?Patient identified by MRN, date of birth, ID band ?Patient awake ? ? ? ?Reviewed: ?Allergy & Precautions, NPO status , Patient's Chart, lab work & pertinent test results ? ?History of Anesthesia Complications ?(+) PONV and history of anesthetic complications ? ?Airway ?Mallampati: II ? ?TM Distance: >3 FB ?Neck ROM: Full ? ? ? Dental ?no notable dental hx. ? ?  ?Pulmonary ?neg pulmonary ROS,  ?  ?Pulmonary exam normal ?breath sounds clear to auscultation ? ? ? ? ? ? Cardiovascular ?negative cardio ROS ?Normal cardiovascular exam ?Rhythm:Regular Rate:Normal ? ? ?  ?Neuro/Psych ?Anxiety negative neurological ROS ? negative psych ROS  ? GI/Hepatic ?negative GI ROS, Neg liver ROS,   ?Endo/Other  ?negative endocrine ROS ? Renal/GU ?negative Renal ROS  ?negative genitourinary ?  ?Musculoskeletal ?negative musculoskeletal ROS ?(+)  ? Abdominal ?  ?Peds ?negative pediatric ROS ?(+)  Hematology ?negative hematology ROS ?(+)   ?Anesthesia Other Findings ?Breast Cancer ? Reproductive/Obstetrics ?negative OB ROS ? ?  ? ? ? ? ? ? ? ? ? ? ? ? ? ?  ?  ? ? ? ? ? ? ? ? ?Anesthesia Physical ?Anesthesia Plan ? ?ASA: 3 ? ?Anesthesia Plan: General  ? ?Post-op Pain Management: Regional block* and Minimal or no pain anticipated  ? ?Induction: Intravenous ? ?PONV Risk Score and Plan: 4 or greater and Ondansetron, Dexamethasone, Midazolam, Droperidol and Treatment may vary due to age or medical condition ? ?Airway Management Planned: LMA ? ?Additional Equipment:  ? ?Intra-op Plan:  ? ?Post-operative Plan: Extubation in OR ? ?Informed Consent: I have reviewed the patients History and Physical, chart, labs and discussed the procedure including the risks, benefits and alternatives for the proposed anesthesia with the patient or authorized representative who has indicated his/her understanding and acceptance.  ? ? ? ?Dental advisory given ? ?Plan Discussed with: CRNA ? ?Anesthesia Plan  Comments:   ? ? ? ? ? ? ?Anesthesia Quick Evaluation ? ?

## 2021-11-10 NOTE — Transfer of Care (Signed)
Immediate Anesthesia Transfer of Care Note ? ?Patient: Kathryn Mckenzie ? ?Procedure(s) Performed: RIGHT BREAST LUMPECTOMY WITH RADIOACTIVE SEED AND SENTINEL LYMPH NODE BIOPSY (Right: Breast) ? ?Patient Location: PACU ? ?Anesthesia Type:GA combined with regional for post-op pain ? ?Level of Consciousness: awake, alert , oriented, drowsy and patient cooperative ? ?Airway & Oxygen Therapy: Patient Spontanous Breathing and Patient connected to face mask oxygen ? ?Post-op Assessment: Report given to RN and Post -op Vital signs reviewed and stable ? ?Post vital signs: Reviewed and stable ? ?Last Vitals:  ?Vitals Value Taken Time  ?BP    ?Temp    ?Pulse    ?Resp    ?SpO2    ? ? ?Last Pain:  ?Vitals:  ? 11/10/21 0813  ?TempSrc: Oral  ?PainSc: 0-No pain  ?   ? ?  ? ?Complications: No notable events documented. ?

## 2021-11-10 NOTE — Op Note (Signed)
11/10/2021 ? ?11:06 AM ? ?PATIENT:  Kathryn Mckenzie  41 y.o. female ? ?PRE-OPERATIVE DIAGNOSIS:  RIGHT BREAST CANCER ? ?POST-OPERATIVE DIAGNOSIS:  RIGHT BREAST CANCER ? ?PROCEDURE:  Procedure(s): ?RIGHT BREAST LUMPECTOMY WITH RADIOACTIVE SEED LOCALIZATION AND DEEP RIGHT AXILLARY SENTINEL LYMPH NODE BIOPSY (Right) ? ?SURGEON:  Surgeon(s) and Role: ?   Jovita Kussmaul, MD - Primary ? ?PHYSICIAN ASSISTANT:  ? ?ASSISTANTS: none  ? ?ANESTHESIA:   local and general ? ?EBL:  10 mL  ? ?BLOOD ADMINISTERED:none ? ?DRAINS: none  ? ?LOCAL MEDICATIONS USED:  MARCAINE    ? ?SPECIMEN:  Source of Specimen:  right breast tissue with sentinel nodes x 2 ? ?DISPOSITION OF SPECIMEN:  PATHOLOGY ? ?COUNTS:  YES ? ?TOURNIQUET:  * No tourniquets in log * ? ?DICTATION: .Dragon Dictation ? ?After informed consent was obtained the patient was brought to the operating room and placed in the supine position on the operating table.  After adequate induction of general anesthesia the patient's right chest, breast, and axillary area was prepped with ChloraPrep, allowed to dry, and draped in usual sterile manner.  An appropriate timeout was performed.  Previously an I-125 seed was placed in the upper outer quadrant of the right breast to mark an area of invasive breast cancer.  A second I-125 seed was placed adjacent to this to mark and on biopsied area.  At this point 2 cc of iron oxide were injected into the subareolar plexus of the right breast and the breast was massaged for 5 minutes.  The neoprobe was set to I-125 and the area of radioactivity was readily identified in the upper outer quadrant.  I elected to make a curvilinear incision along the upper outer edge of the right breast with a 15 blade knife.  The incision was carried through the skin and subcutaneous tissue sharply with the electrocautery.  The dissection was then carried into the upper outer quadrant between the breast tissue and the subcutaneous fat and skin.  Once this dissection  was well beyond the area of the cancer I then removed a circular portion of breast tissue sharply with the electrocautery around the 2 radioactive seeds while checking the area of radioactivity frequently.  This dissection was carried all the way to the muscle of the chest wall.  Once the specimen was removed it was oriented with the appropriate paint colors.  A specimen radiograph was obtained that showed the 2 clips and 2 seeds to be near the center of the specimen.  The specimen was then sent to pathology for further evaluation.  Hemostasis was achieved using the Bovie electrocautery and the cavity was marked with clips.  The dissection was then carried through this incision into the deep right axillary space under the direction of the mag trace.  I was able to identify 2 lymph nodes with a signal.  These nodes were excised sharply with the electrocautery and the surrounding small vessels and lymphatics were controlled with clips.  These were sent to pathology as sentinel nodes numbers 1 and 2.  No other hot or palpable nodes were identified in the right axilla.  Hemostasis was achieved using the Bovie electrocautery.  The axilla was then closed with interrupted 3-0 Vicryl stitches.  The lumpectomy cavity was then also closed with layers of interrupted 3-0 Vicryl stitches.  The skin was then closed with a running 4-0 Monocryl subcuticular stitch.  Dermabond dressings were applied.  The patient tolerated the procedure well.  At the end  of the case all needle sponge and instrument counts were correct.  The patient was then awakened and taken to recovery in stable condition. ? ?PLAN OF CARE: Discharge to home after PACU ? ?PATIENT DISPOSITION:  PACU - hemodynamically stable. ?  ?Delay start of Pharmacological VTE agent (>24hrs) due to surgical blood loss or risk of bleeding: not applicable ? ?

## 2021-11-11 ENCOUNTER — Encounter (HOSPITAL_BASED_OUTPATIENT_CLINIC_OR_DEPARTMENT_OTHER): Payer: Self-pay | Admitting: General Surgery

## 2021-11-11 ENCOUNTER — Encounter (HOSPITAL_COMMUNITY): Payer: Self-pay

## 2021-11-12 LAB — SURGICAL PATHOLOGY

## 2021-11-16 ENCOUNTER — Encounter: Payer: Self-pay | Admitting: *Deleted

## 2021-11-16 ENCOUNTER — Telehealth: Payer: Self-pay | Admitting: Hematology and Oncology

## 2021-11-16 ENCOUNTER — Telehealth: Payer: Self-pay | Admitting: *Deleted

## 2021-11-16 NOTE — Telephone Encounter (Signed)
Per 4/25 in basket called and spoke to pt about appointment pt confirmed appointment  ?

## 2021-11-16 NOTE — Telephone Encounter (Signed)
Received order for oncotype testing. Requisition faxed to pathology and [facility] °

## 2021-11-25 ENCOUNTER — Other Ambulatory Visit: Payer: Self-pay

## 2021-11-25 ENCOUNTER — Inpatient Hospital Stay
Payer: No Typology Code available for payment source | Attending: Hematology and Oncology | Admitting: Hematology and Oncology

## 2021-11-25 DIAGNOSIS — C50411 Malignant neoplasm of upper-outer quadrant of right female breast: Secondary | ICD-10-CM | POA: Diagnosis present

## 2021-11-25 DIAGNOSIS — Z170 Estrogen receptor positive status [ER+]: Secondary | ICD-10-CM | POA: Diagnosis not present

## 2021-11-25 NOTE — Assessment & Plan Note (Addendum)
09/02/2021:Palpable right breast lump for 2 to 3 months UOQ.  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 ?11/10/2021: Right lumpectomy: Grade 1 IDC 1.6 cm with intermediate grade DCIS, margins negative, 0/8 lymph nodes negative, ER 90%, PR 100%, HER2 negative 1+, Ki-67 1% ? ?Pathology counseling: I discussed the final pathology report of the patient provided  a copy of this report. I discussed the margins as well as lymph node surgeries. We also discussed the final staging along with previously performed ER/PR and HER-2/neu testing. ? ?Treatment plan: ?1. Oncotype DX testing to determine if chemotherapy would be of any benefit followed by ?2. Adjuvant radiation therapy followed by ?3. Adjuvant antiestrogen therapy ? ?Return to clinic based upon Oncotype DX test result ?

## 2021-11-25 NOTE — Progress Notes (Signed)
? ?Patient Care Team: ?Holland Commons, FNP as PCP - General (Internal Medicine) ?Jovita Kussmaul, MD as Consulting Physician (General Surgery) ?Nicholas Lose, MD as Consulting Physician (Hematology and Oncology) ?Eppie Gibson, MD as Attending Physician (Radiation Oncology) ? ?DIAGNOSIS:  ?Encounter Diagnosis  ?Name Primary?  ? Malignant neoplasm of upper-outer quadrant of right breast in female, estrogen receptor positive ([place])   ? ? ?SUMMARY OF ONCOLOGIC HISTORY: ?Oncology History  ?Malignant neoplasm of upper-outer quadrant of right breast in female, estrogen receptor positive ([place])  ?09/02/2021 Initial Diagnosis  ? Palpable right breast lump for 2 to 3 months UOQ.  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  ?09/08/2021 Cancer Staging  ? Staging form: Breast, AJCC 8th Edition ?- Clinical stage from 09/08/2021: Stage IA (cT1c, cN0, cM0, G1, ER+, PR+, HER2-) - Signed by Nicholas Lose, MD on 09/08/2021 ?Stage prefix: Initial diagnosis ?Histologic grading system: 3 grade system ? ?  ? Genetic Testing  ? [facility] CancerNext-Expanded is Negative. Report date is 09/17/2021. ? ?The CancerNext-Expanded gene panel offered by [facility] and includes sequencing, rearrangement, and RNA analysis for the following 77 genes: AIP, ALK, APC, ATM, AXIN2, BAP1, BARD1, BLM, BMPR1A, BRCA1, BRCA2, BRIP1, CDC73, CDH1, CDK4, CDKN1B, CDKN2A, CHEK2, CTNNA1, DICER1, FANCC, FH, FLCN, GALNT12, KIF1B, LZTR1, MAX, MEN1, MET, MLH1, MSH2, MSH3, MSH6, MUTYH, NBN, NF1, NF2, NTHL1, PALB2, PHOX2B, PMS2, POT1, PRKAR1A, PTCH1, PTEN, RAD51C, RAD51D, RB1, RECQL, RET, SDHA, SDHAF2, SDHB, SDHC, SDHD, SMAD4, SMARCA4, SMARCB1, SMARCE1, STK11, SUFU, TMEM127, TP53, TSC1, TSC2, VHL and XRCC2 (sequencing and deletion/duplication); EGFR, EGLN1, HOXB13, KIT, MITF, PDGFRA, POLD1, and POLE (sequencing only); EPCAM and GREM1 (deletion/duplication  only).  ?  ?11/10/2021 Surgery  ? Right lumpectomy: Grade 1 IDC 1.6 cm with intermediate grade DCIS, margins negative, 0/8 lymph nodes negative, ER 90%, PR 100%, HER2 negative 1+, Ki-67 1% ?  ? ? ?CHIEF COMPLIANT: Follow-up of recent right lumpectomy ? ?INTERVAL HISTORY: Kathryn Mckenzie is a 41 year old above-mentioned history of right breast cancer underwent right lumpectomy and is here today to discuss pathology report.  She is in a lot of discomfort from recent surgery especially under the arm.  She was told that she has a hematoma right chest wall.  She has been working ever since she had her surgery but she has not been lifting any heavy weights.  She is advised to apply compression garments and to follow-up with her surgeon. ? ? ?ALLERGIES:  has No Known Allergies. ? ?MEDICATIONS:  ?Current Outpatient Medications  ?Medication Sig Dispense Refill  ? oxyCODONE (ROXICODONE) 5 MG immediate release tablet Take 1 tablet (5 mg total) by mouth every 6 (six) hours as needed for severe pain. 10 tablet 0  ? sertraline (ZOLOFT) 25 MG tablet Take by mouth.    ? ?No current facility-administered medications for this visit.  ? ? ?PHYSICAL EXAMINATION: ?ECOG PERFORMANCE STATUS: 1 - Symptomatic but completely ambulatory ? ?Vitals:  ? 11/25/21 1539  ?BP: 108/77  ?Pulse: 83  ?Resp: 18  ?Temp: 97.7 ?F (36.5 ?C)  ?SpO2: 100%  ? ?Filed Weights  ? 11/25/21 1539  ?Weight: 123 lb 9.6 oz (56.1 kg)  ? ?  ? ?LABORATORY DATA:  ?I have reviewed the data as listed ? ?  Latest Ref Rng & Units 09/08/2021  ? 12:25 PM  ?CMP  ?Glucose 70 - 99 mg/dL 99    ?BUN 6 - 20  mg/dL 15    ?Creatinine 0.44 - 1.00 mg/dL 0.97    ?Sodium 135 - 145 mmol/L 139    ?Potassium 3.5 - 5.1 mmol/L 4.6    ?Chloride 98 - 111 mmol/L 105    ?CO2 22 - 32 mmol/L 28    ?Calcium 8.9 - 10.3 mg/dL 9.6    ?Total Protein 6.5 - 8.1 g/dL 7.0    ?Total Bilirubin 0.3 - 1.2 mg/dL 0.4    ?Alkaline Phos 38 - 126 U/L 39    ?AST 15 - 41 U/L 18    ?ALT 0 - 44 U/L 15    ? ? ?Lab Results  ?Component  Value Date  ? WBC 5.9 09/08/2021  ? HGB 12.9 09/08/2021  ? HCT 38.3 09/08/2021  ? MCV 95.8 09/08/2021  ? PLT 239 09/08/2021  ? NEUTROABS 3.4 09/08/2021  ? ? ?ASSESSMENT & PLAN:  ?Malignant neoplasm of upper-outer quadrant of right breast in female, estrogen receptor positive ([place]) ?09/02/2021:Palpable right breast lump for 2 to 3 months UOQ.  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 ?11/10/2021: Right lumpectomy: Grade 1 IDC 1.6 cm with intermediate grade DCIS, margins negative, 0/8 lymph nodes negative, ER 90%, PR 100%, HER2 negative 1+, Ki-67 1% ? ?Pathology counseling: I discussed the final pathology report of the patient provided  a copy of this report. I discussed the margins as well as lymph node surgeries. We also discussed the final staging along with previously performed ER/PR and HER-2/neu testing. ?She developed a hematoma in the chest wall and is now using compression garments to put pressure. ? ?Treatment plan: ?1. Oncotype DX testing to determine if chemotherapy would be of any benefit followed by ?2. Adjuvant radiation therapy followed by ?3. Adjuvant antiestrogen therapy ? ?Return to clinic based upon Oncotype DX test result ? ? ?No orders of the defined types were placed in this encounter. ? ?The patient has a good understanding of the overall plan. she agrees with it. she will call with any problems that may develop before the next visit here. ?Total time spent: 30 mins including face to face time and time spent for planning, charting and co-ordination of care ? ? Harriette Ohara, MD ?11/25/21 ? ? ? ?

## 2021-11-29 ENCOUNTER — Encounter (HOSPITAL_COMMUNITY): Payer: Self-pay

## 2021-11-30 ENCOUNTER — Encounter: Payer: Self-pay | Admitting: *Deleted

## 2021-11-30 ENCOUNTER — Telehealth: Payer: Self-pay | Admitting: *Deleted

## 2021-11-30 DIAGNOSIS — Z170 Estrogen receptor positive status [ER+]: Secondary | ICD-10-CM

## 2021-11-30 NOTE — Telephone Encounter (Signed)
Received oncotype results of 15/4%. Spoke with patient and she is aware. Referral placed for Dr. Isidore Moos. ?

## 2021-12-06 ENCOUNTER — Ambulatory Visit: Payer: No Typology Code available for payment source | Attending: General Surgery | Admitting: Physical Therapy

## 2021-12-06 ENCOUNTER — Encounter: Payer: Self-pay | Admitting: Physical Therapy

## 2021-12-06 DIAGNOSIS — M79601 Pain in right arm: Secondary | ICD-10-CM | POA: Diagnosis present

## 2021-12-06 DIAGNOSIS — Z483 Aftercare following surgery for neoplasm: Secondary | ICD-10-CM | POA: Diagnosis present

## 2021-12-06 DIAGNOSIS — R293 Abnormal posture: Secondary | ICD-10-CM | POA: Diagnosis present

## 2021-12-06 DIAGNOSIS — Z170 Estrogen receptor positive status [ER+]: Secondary | ICD-10-CM | POA: Diagnosis present

## 2021-12-06 DIAGNOSIS — C50411 Malignant neoplasm of upper-outer quadrant of right female breast: Secondary | ICD-10-CM | POA: Insufficient documentation

## 2021-12-06 NOTE — Therapy (Signed)
?OUTPATIENT PHYSICAL THERAPY BREAST CANCER POST OP FOLLOW UP ? ? ?Patient Name: Kathryn Mckenzie ?MRN: [id number] ?DOB:1981/03/10, 41 y.o., female ?Today's Date: 12/06/2021 ? ? PT End of Session - 12/06/21 1009   ? ? Visit Number 2   ? Number of Visits 10   ? Date for PT Re-Evaluation 01/03/22   ? PT Start Time 1006   ? PT Stop Time 1055   ? PT Time Calculation (min) 49 min   ? Activity Tolerance Patient tolerated treatment well   ? Behavior During Therapy [place] for tasks assessed/performed   ? ?  ?  ? ?  ? ? ?Past Medical History:  ?Diagnosis Date  ? Anxiety   ? Breast cancer ([place])   ? PONV (postoperative nausea and vomiting)   ? ?Past Surgical History:  ?Procedure Laterality Date  ? BREAST LUMPECTOMY WITH RADIOACTIVE SEED AND SENTINEL LYMPH NODE BIOPSY Right 11/10/2021  ? Procedure: RIGHT BREAST LUMPECTOMY WITH RADIOACTIVE SEED AND SENTINEL LYMPH NODE BIOPSY;  Surgeon: Jovita Kussmaul, MD;  Location: [facility];  Service: General;  Laterality: Right;  ? CESAREAN SECTION    ? TONSILLECTOMY    ? ?Patient Active Problem List  ? Diagnosis Date Noted  ? Genetic testing 09/20/2021  ? Family history of breast cancer 09/09/2021  ? Family history of pancreatic cancer 09/09/2021  ? Malignant neoplasm of upper-outer quadrant of right breast in female, estrogen receptor positive ([place]) 09/07/2021  ? ? ?PCP: Thedora Hinders, PA ? ?REFERRING PROVIDER: Dr. Autumn Messing ? ?REFERRING DIAG: Right breast cancer ? ?THERAPY DIAG:  ?Malignant neoplasm of upper-outer quadrant of right breast in female, estrogen receptor positive ([place]) ? ?Abnormal posture ? ?Aftercare following surgery for neoplasm ? ?Pain in right arm ? ?ONSET DATE: 11/10/2021 ? ?SUBJECTIVE:                                                                                                                                                                                          ? ?SUBJECTIVE STATEMENT: ?Patient reports she underwent a right lumpectomy and sentinel node biopsy (8  negative nodes) on 11/10/2021. She had Oncotype testing which was 15 so no chemo is needed. She will undergo radiation and anti-estrogen therapy. She has had significant arm pain and a right chest hematoma. ? ?PERTINENT HISTORY:  ?Patient was diagnosed on 08/30/2021 with right grade I invasive ductal carcinoma breast cancer. She underwent a right lumpectomy and sentinel node biopsy (8 negative nodes) on 11/10/2021. It is ER/PR positive and HER2 negative with a Ki67 of 1%.   ? ?PATIENT GOALS:  Reassess how my recovery is going related to arm  function, pain, and swelling. ? ?PAIN:  ?Are you having pain? Yes: NPRS scale: 6/10 ?Pain location: right shoulder and right chest and axilla ?Pain description: tingling and burning; arm feels asleep ?Aggravating factors: Activity ?Relieving factors: Being still ? ?PRECAUTIONS: Recent Surgery, right UE Lymphedema risk ? ?ACTIVITY LEVEL / LEISURE: Pure [place]re 3-4x/week but has not returned to this yet. She has not resumed walking. ? ? ?OBJECTIVE:  ? ?PATIENT SURVEYS:  ?QUICK DASH: ? [name] - 12/06/21 0001   ? ? Open a tight or new jar Moderate difficulty   ? Do heavy household chores (wash walls, wash floors) Unable   ? Carry a shopping bag or briefcase Moderate difficulty   ? Wash your back Severe difficulty   ? Use a knife to cut food Mild difficulty   ? Recreational activities in which you take some force or impact through your arm, shoulder, or hand (golf, hammering, tennis) Unable   ? During the past week, to what extent has your arm, shoulder or hand problem interfered with your normal social activities with family, friends, neighbors, or groups? Extremely   ? During the past week, to what extent has your arm, shoulder or hand problem limited your work or other regular daily activities Quite a bit   ? Arm, shoulder, or hand pain. Moderate   ? Tingling (pins and needles) in your arm, shoulder, or hand Moderate   ? Difficulty Sleeping Moderate difficulty   ? DASH Score 65.91 %    ? ?  ?  ? ?  ? ? ? ?OBSERVATIONS: ? Visible axillary cording present on right side. Significant hematoma present on right upper outer breast. She sees her surgeon tomorrow and plans to ask about the possibility of draining the hematoma. Incision appears to be healing well with glue still present. ? ?POSTURE:  ?Forward head and mild rounded shoulders posture ? ? ?LYMPHEDEMA ASSESSMENT:  ? ?UPPER EXTREMITY AROM/PROM: ?  ?A/PROM RIGHT  09/08/2021 ?  RIGHT 12/06/2021  ?Shoulder extension 57 34  ?Shoulder flexion 157 127  ?Shoulder abduction 160 129  ?Shoulder internal rotation 65 73  ?Shoulder external rotation 90 88  ?                        (Blank rows = not tested) ?  ?A/PROM LEFT  09/08/2021  ?Shoulder extension 61  ?Shoulder flexion 146  ?Shoulder abduction 169  ?Shoulder internal rotation 71  ?Shoulder external rotation 85  ?                        (Blank rows = not tested) ?  ?  ?CERVICAL AROM: ?All within normal limits ?  ?UPPER EXTREMITY STRENGTH: WFL ?  ?  ?LYMPHEDEMA ASSESSMENTS:  ?  ?[place] RIGHT  09/08/2021 RIGHT 12/06/2021  ?10 cm proximal to olecranon process 26.6 25.6  ?Olecranon process 22.6 22.6  ?10 cm proximal to ulnar styloid process 20.5 20.2  ?Just proximal to ulnar styloid process 14.4 14  ?Across hand at thumb web space 18.2 18.1  ?At base of 2nd digit 6 5.9  ?(Blank rows = not tested) ?  ?[place] LEFT  09/08/2021 LEFT 12/06/2021  ?10 cm proximal to olecranon process 26.7 26.2  ?Olecranon process 22.4 22.3  ?10 cm proximal to ulnar styloid process 20 20  ?Just proximal to ulnar styloid process 13.9 13.7  ?Across hand at thumb web space 17.7 17.8  ?At base of 2nd digit 5.9  5.9  ?(Blank rows = not tested) ?  ?  ?Surgery type/Date: 11/10/2021 right lumpectomy and sentinel node biopsy ?Number of lymph nodes removed: 8 ?Current/past treatment (chemo, radiation, hormone therapy): none ?Other symptoms:  ?Heaviness/tightness Yes ?Pain Yes ?Pitting edema Yes ?Infections No ?Decreased scar mobility  Yes ?Stemmer sign No ? ? ?PATIENT EDUCATION:  ?Education details: HEP ?Person educated: Patient ?Education method: Explanation, Demonstration, and Handouts ?Education comprehension: verbalized understanding and returned demonstration ? ? ?HOME EXERCISE PROGRAM: ? Reviewed previously given post op HEP. ? ?PT applied compression foam (inside a stockinette) to her right chest between her bra and skin to help support and reduce edema present in the right upper outer chest. ? ?ASSESSMENT: ? ?CLINICAL IMPRESSION: ?Patient underwent a right lumpectomy and sentinel node biopsy (8 negative nodes) on 11/10/2021. She had Oncotype testing which was 15 so no chemo is needed. She will undergo radiation and anti-estrogen therapy. Her incision is healing well but she has a significant hematoma present on her right upper outer chest which is painful and limiting ROM. She also has visible axillary cording present which is likely limiting some ROM. She will benefit from PT to regain shoulder ROM and function, reduce pain and swelling. ? ? ?Pt will benefit from skilled therapeutic intervention to improve on the following deficits: Decreased knowledge of precautions, impaired UE functional use, pain, decreased ROM, postural dysfunction.  ? ?PT treatment/interventions: ADL/Self care home management, Therapeutic exercises, Therapeutic activity, Neuromuscular re-education, Balance training, Gait training, Patient/Family education, Joint mobilization, Manual lymph drainage, scar mobilization, and Manual therapy ? ? ? ? ?GOALS: ?Goals reviewed with patient? Yes ? ?LONG TERM GOALS:  (STG=LTG) ? ?GOALS Name Target Date ?(Remove Blue Hyperlink) Goal status  ?1 Pt will demonstrate she has regained full shoulder ROM and function post operatively compared to baselines.  ? 01/03/2022 IN PROGRESS  ?2 Patient will increase right shoulder flexion and abduction to >/= 155 degrees for increased ability to reach overhead and obtain radiation positioning.  01/03/2022 INITIAL  ?3 Patient will improve her DASH score to be </= 10 for improved overall UE function. 01/03/2022 INITIAL  ?4 Patient will report >/= 50% reduction in right chest edema and pain to tolerate dail

## 2021-12-06 NOTE — Patient Instructions (Signed)
[facility] ? [address], [address] ? [place] [address] ? [phone] ? ?After Breast Cancer Class ?It is recommended you attend the ABC class to be educated on lymphedema risk reduction. This class is free of charge and lasts for 1 hour. It is a 1-time class. You will need to download the Webex app either on your phone or computer. We will send you a link the night before or the morning of the class. You should be able to click on that link to join the class. This is not a confidential class. You don't have to turn your camera on, but other participants may be able to see your email address. You are scheduled for June 5th at 11:00. ? ?Scar massage ?You can begin gentle scar massage to you incision sites. Gently place one hand on the incision and move the skin (without sliding on the skin) in various directions. Do this for a few minutes and then you can gently massage either coconut oil or vitamin E cream into the scars. Hold off on this until Dt. Marlou Starks says it's ok. ? ?Compression garment ?You should continue wearing your compression bra until you feel like you no longer have swelling. ? ?Home exercise Program ?Continue doing the exercises you were given until you feel like you can do them without feeling any tightness at the end.  ? ?Walking Program ?Studies show that 30 minutes of walking per day (fast enough to elevate your heart rate) can significantly reduce the risk of a cancer recurrence. If you can't walk due to other medical reasons, we encourage you to find another activity you could do (like a stationary bike or water exercise). ? ?Posture ?After breast cancer surgery, people frequently sit with rounded shoulders posture because it puts their incisions on slack and feels better. If you sit like this and scar tissue forms in that position, you can become very tight and have pain sitting or standing with good posture. Try to be aware of your posture and sit and stand up tall to  heal properly. ? ?Follow up PT: ?It is recommended you return every 3 months for the first 3 years following surgery to be assessed on the SOZO machine for an L-Dex score. This helps prevent clinically significant lymphedema in 95% of patients. These follow up screens are 10 minute appointments that you are not billed for. You are scheduled for 9:50 on July 17th. ?

## 2021-12-07 ENCOUNTER — Encounter: Payer: Self-pay | Admitting: *Deleted

## 2021-12-07 NOTE — Progress Notes (Incomplete)
Location of Breast Cancer:  ?Malignant neoplasm of upper-outer quadrant of right breast in female, estrogen receptor positive ? ?Histology per Pathology Report:  ?11/10/2021 ?FINAL MICROSCOPIC DIAGNOSIS:  ?A. BREAST, RIGHT, LUMPECTOMY:  ?- Invasive ductal carcinoma, 1.6 cm, grade 1 (ribbon clip)  ?- Ductal carcinoma in situ, intermediate grade  ?- Resection margins are negative for carcinoma - closest are the posterior and anterior margins at 1 mm  ?- Benign intramammary lymph node (0/1) (coil clip)  ?- Biopsy site changes  ?- See oncology table  ?B. LYMPH NODE, RIGHT AXILLA #1, SENTINEL, EXCISION:  ?- Lymph node, negative for carcinoma (0/1)  ?C. LYMPH NODE, RIGHT AXILLA, SENTINEL, EXCISION:  ?- Lymph node, negative for carcinoma (0/1)  ?D. LYMPH NODE, RIGHT AXILLA, SENTINEL, EXCISION:  ?- Lymph node, negative for carcinoma (0/1)  ?E. LYMPH NODE, RIGHT AXILLA, SENTINEL, EXCISION:  ?- Lymph node, negative for carcinoma (0/1)  ?F. LYMPH NODE, RIGHT AXILLA, SENTINEL, EXCISION:  ?- Lymph node, negative for carcinoma (0/1)  ?G. LYMPH NODE, RIGHT AXILLA #2, SENTINEL, EXCISION:  ?- Lymph node, negative for carcinoma (0/1)  ?H. LYMPH NODE, RIGHT AXILLA, SENTINEL, EXCISION:  ?- Lymph node, negative for carcinoma (0/1)  ? ?Receptor Status: ER(90%), PR (100%), Her2-neu (Negative), Ki-67(1%) ? ?Did patient present with symptoms (if so, please note symptoms) or was this found on screening mammography?: Patient presented with a palpable mass in the right breast. Diagnostic mammogram and [place] on 08/30/2021 showed suspicious right breast mass at the 10 o'clock position 8 cm from the nipple ? ?Past/Anticipated interventions by surgeon, if any:  ?11/18/2021 ?--Dr. Autumn Messing (office visit)  ?The patient will start taking Neurontin to see if it helps with the arm discomfort.  ?She will use warm compresses and continue to wear the compression bra for the hematoma in the right breast. ? ?11/10/2021 ?--Dr. Autumn Messing ?RIGHT BREAST  LUMPECTOMY WITH RADIOACTIVE SEED LOCALIZATION  ?DEEP RIGHT AXILLARY SENTINEL LYMPH NODE BIOPSY  ? ?Past/Anticipated interventions by medical oncology, if any:  ?Under care of Dr. Nicholas Lose ?11/25/2021 ?--Treatment plan: ?Oncotype DX testing to determine if chemotherapy would be of any benefit followed by ?11/30/2021 Varney Biles Martini-Navigator: "Received oncotype results of 15/4%. Spoke with patient and she is aware" ?Adjuvant radiation therapy followed by ?Adjuvant antiestrogen therapy ?--Return to clinic based upon Oncotype DX test result ? ?Lymphedema issues, if any:  ***   ? ?Pain issues, if any:  ***  ? ?SAFETY ISSUES: ?Prior radiation? *** ?Pacemaker/ICD? *** ?Possible current pregnancy?*** ?Is the patient on methotrexate? *** ? ?Current Complaints / other details:  *** ?   ? ? ? ?

## 2021-12-07 NOTE — Progress Notes (Addendum)
Radiation Oncology         ([phone]) [phone] ________________________________  Name: Kathryn Mckenzie MRN: [id number]  Date: 12/08/2021  DOB: 1981-06-15  Follow-Up Visit Note  Outpatient  CC: Holland Commons, FNP  Nicholas Lose, MD  Diagnosis:      ICD-10-CM   1. Malignant neoplasm of upper-outer quadrant of right breast in female, estrogen receptor positive ([place])  C50.411 Pregnancy, urine   Z17.0 CANCELED: Pregnancy, urine      S/p lumpectomy: Right Breast UOQ, Invasive Ductal Carcinoma with Intermediate-grade DCIS, ER+ / PR+ / Her2-, Grade 1   Cancer Staging  Malignant neoplasm of upper-outer quadrant of right breast in female, estrogen receptor positive ([place]) Staging form: Breast, AJCC 8th Edition - Clinical stage from 09/08/2021: Stage IA (cT1c, cN0, cM0, G1, ER+, PR+, HER2-) - Signed by Nicholas Lose, MD on 09/08/2021  CHIEF COMPLAINT: Here to discuss management of right breast cancer  Narrative:  The patient returns today for follow-up.     On her breast clinic consultation date of 09/08/21, the patient underwent genetic testing which revealed no clinically significant variants detected by BRCAplus of +RNAinsight testing.   Since her initial consultation date, she underwent the following imaging (dates and results as follows):  -- Bilateral breast MRI on 09/21/21 showed an irregular enhancing mass within the outer left breast, 2-3 o'clock axis region, measuring 1.2 cm. MRI also showed the biopsy proven invasive carcinoma within the upper outer quadrant of the right breast measuring 1.6 cm. No evidence of additional multifocal or multicentric disease was appreciated within the right breast. -- Ultrasound of the right axilla to evaluate a palpable right axillary mass on 09/30/21 revealed the palpable mass as indeterminate. [place] also showed a probable intramammary lymph node just superior to the known malignancy in the 10:30 o'clock location of the right breast.  Breast or nodal biopsies,  since consultation, involved (dates and results as follows):  -- Biopsy of the outer left breast on 09/30/21 revealed pseudoangiomatous stromal hyperplasia without evidence of malignancy.  -- Biopsy of a lower right axillary lymph node on 10/05/21 showed no evidence of malignancy; fibroadenoma.   The patient opted to proceed with right breast lumpectomy with SLN biopsies on 11/10/21 under the care of Dr. Marlou Starks. Pathology from the procedure revealed: tumor size of 1.6 cm; histology of grade 1 invasive ductal carcinoma with intermediate grade DCIS; all margins negative for both invasive and in-situ carcinoma; margin status to invasive disease of 1 mm from the anterior and posterior margins; margin status to in situ disease of 2.5 mm from the anterior margin; nodal status of 7/7 right axillary sentinel lymph nodes negative for carcinoma, and 1/1 intramammary lymph node negative for carcinoma;  ER status: 90% positive with moderate staining intensity; PR status 100% positive with strong staining intensity; Proliferation marker Ki67 at 1%; Her2 status negative; Grade 1.  Oncotype DX was obtained on the final surgical sample and the recurrence score of 15 predicts a risk of recurrence outside the breast over the next 9 years of 4%, if the patient's only systemic therapy is an antiestrogen for 5 years.  It also predicts no significant benefit from chemotherapy.  During a post-op follow-up with Dr. Marlou Starks on 11/18/21, the patient endorsed some swelling and pain in the right breast. She also reported some tingling and discomfort that runs down her right arm. Physical exam revealed a moderate sized hematoma at the operative site. She was instructed to continue using her compression bra for this. For  her arm discomfort, Dr. Marlou Starks started her on Neurontin. Otherwise, no signs of infection were appreciated and the patient will return to Dr. Marlou Starks in the coming week to assess her progress.   The patient will follow-up with  Dr. Lindi Adie in the near further to review Oncotype results and to discuss antiestrogen treatment options.   Symptomatically, the patient reports: continued soreness, firmness, swelling at UOQ hematoma site.  Bruising at this site too. It is not liquefied and cannot be drained per patient report. She is seeing PT.        ALLERGIES:  has No Known Allergies.  Meds: Current Outpatient Medications  Medication Sig Dispense Refill   gabapentin (NEURONTIN) 100 MG capsule Take 100 mg by mouth 3 (three) times daily as needed.     sertraline (ZOLOFT) 25 MG tablet Take by mouth.     No current facility-administered medications for this encounter.    Physical Findings:  height is 5' 4"  (1.626 m) and weight is 125 lb (56.7 kg). Her temporal temperature is 96.8 F (36 C) (abnormal). Her blood pressure is 124/75 and her pulse is 69. Her respiration is 18 and oxygen saturation is 100%. .     General: Alert and oriented, in no acute distress HEENT: Head is normocephalic. Extraocular movements are intact.  Ext : no UE edema Musculoskeletal: symmetric strength and muscle tone throughout. Neurologic: No obvious focalities. Speech is fluent.  Psychiatric: Judgment and insight are intact. Affect is appropriate. Breast exam reveals firm palpable mass at UOQ of right breast c/w coagulated hematoma in lumpectomy cavity. Mild overlying erythema and bruising at this site. Scar intact.  Lab Findings: Lab Results  Component Value Date   WBC 5.9 09/08/2021   HGB 12.9 09/08/2021   HCT 38.3 09/08/2021   MCV 95.8 09/08/2021   PLT 239 09/08/2021    @LASTCHEMISTRY @  Radiographic Findings: MM Breast Surgical Specimen  Result Date: 11/10/2021 CLINICAL DATA:  Status post seed localized lumpectomy of the RIGHT breast. EXAM: SPECIMEN RADIOGRAPH OF THE RIGHT BREAST COMPARISON:  Previous exam(s). FINDINGS: Status post excision of the right breast. Two radioactive seeds, a ribbon shaped clip, and a coil shaped clip are  present and completely intact. Findings are discussed with the operating room nurse at the time of interpretation. IMPRESSION: Specimen radiograph of the right breast. Electronically Signed   By: Nolon Nations M.D.   On: 11/10/2021 10:37   Impression/Plan: Right breast cancer, Stage I, ER+  Post op hematoma, following with PT. Cannot be drained.  We discussed adjuvant radiotherapy today.  I recommend radiation therapy to her right breast in order to reduce risk of locoregional recurrent by 2/3.  I reviewed the logistics, benefits, risks, and potential side effects of this treatment in detail. Risks may include but not necessary be limited to acute and late injury tissue in the radiation fields such as skin irritation (change in color/pigmentation, itching, dryness, pain, peeling). She may experience fatigue. We also discussed possible risk of long term cosmetic changes or scar tissue. There is also a smaller risk for lung toxicity, brachial plexopathy, lymphedema, musculoskeletal changes, rib fragility or induction of a second malignancy, late chronic non-healing soft tissue wound.  Given her hematoma, there is a risk for permanent firmness/fibrosis in the UOQ with or without radiation therapy.  I recommend we start RT within 8 weeks of surgery due to retrospective data indicating better outcomes for patients without significant delays.  We will try to start RT during the beginning of June,  to stay within this time period and allow her to go on a trip with friends in early July.  The patient asked good questions which I answered to her satisfaction. She is enthusiastic about proceeding with treatment. A consent form has been signed and placed in her chart. RT planning will occur at the end of May.   On date of service, in total, I spent 40 minutes on this encounter. Patient was seen in person. Note was signed a day after date of service, and that time was not added to the total minutes  above. _____________________________________   Eppie Gibson, MD  This document serves as a record of services personally performed by Eppie Gibson, MD. It was created on her behalf by Roney Mans, a trained medical scribe. The creation of this record is based on the scribe's personal observations and the provider's statements to them. This document has been checked and approved by the attending provider.

## 2021-12-08 ENCOUNTER — Other Ambulatory Visit: Payer: Self-pay | Admitting: Emergency Medicine

## 2021-12-08 ENCOUNTER — Encounter: Payer: Self-pay | Admitting: Radiation Oncology

## 2021-12-08 ENCOUNTER — Ambulatory Visit
Admission: RE | Admit: 2021-12-08 | Discharge: 2021-12-08 | Disposition: A | Discharge status: Home / Self Care | Payer: No Typology Code available for payment source | Source: Ambulatory Visit | Attending: Radiation Oncology | Admitting: Radiation Oncology

## 2021-12-08 ENCOUNTER — Other Ambulatory Visit: Payer: Self-pay

## 2021-12-08 VITALS — BP 124/75 | HR 69 | Temp 96.8°F | Resp 18 | Ht 64.0 in | Wt 125.0 lb

## 2021-12-08 DIAGNOSIS — Z170 Estrogen receptor positive status [ER+]: Secondary | ICD-10-CM | POA: Diagnosis not present

## 2021-12-08 DIAGNOSIS — C50411 Malignant neoplasm of upper-outer quadrant of right female breast: Secondary | ICD-10-CM | POA: Diagnosis present

## 2021-12-08 NOTE — Progress Notes (Signed)
Notified Kathryn Mckenzie that she needs to take a urine pregnancy test at the lab on 12/15/2021 @ 1215 before her ct sim that is scheduled for 100pm  on 12/15/2021.Patient verbalize that she understood times and date for her appointments for both lab and Ct sim ?

## 2021-12-08 NOTE — Progress Notes (Signed)
Location of Breast Cancer:  ?Malignant neoplasm of upper-outer quadrant of right breast in female, estrogen receptor positive ? ?Histology per Pathology Report:  ?11/10/2021 ?FINAL MICROSCOPIC DIAGNOSIS:  ?A. BREAST, RIGHT, LUMPECTOMY:  ?- Invasive ductal carcinoma, 1.6 cm, grade 1 (ribbon clip)  ?- Ductal carcinoma in situ, intermediate grade  ?- Resection margins are negative for carcinoma - closest are the posterior and anterior margins at 1 mm  ?- Benign intramammary lymph node (0/1) (coil clip)  ?- Biopsy site changes  ?- See oncology table  ?B. LYMPH NODE, RIGHT AXILLA #1, SENTINEL, EXCISION:  ?- Lymph node, negative for carcinoma (0/1)  ?C. LYMPH NODE, RIGHT AXILLA, SENTINEL, EXCISION:  ?- Lymph node, negative for carcinoma (0/1)  ?D. LYMPH NODE, RIGHT AXILLA, SENTINEL, EXCISION:  ?- Lymph node, negative for carcinoma (0/1)  ?E. LYMPH NODE, RIGHT AXILLA, SENTINEL, EXCISION:  ?- Lymph node, negative for carcinoma (0/1)  ?F. LYMPH NODE, RIGHT AXILLA, SENTINEL, EXCISION:  ?- Lymph node, negative for carcinoma (0/1)  ?G. LYMPH NODE, RIGHT AXILLA #2, SENTINEL, EXCISION:  ?- Lymph node, negative for carcinoma (0/1)  ?H. LYMPH NODE, RIGHT AXILLA, SENTINEL, EXCISION:  ?- Lymph node, negative for carcinoma (0/1)  ? ?Receptor Status: ER(90%), PR (100%), Her2-neu (Negative), Ki-67(1%) ? ?Did patient present with symptoms (if so, please note symptoms) or was this found on screening mammography?: Patient presented with a palpable mass in the right breast. Diagnostic mammogram and [place] on 08/30/2021 showed suspicious right breast mass at the 10 o'clock position 8 cm from the nipple ? ?Past/Anticipated interventions by surgeon, if any:  ?11/18/2021 ?--Dr. Autumn Messing (office visit)  ?The patient will start taking Neurontin to see if it helps with the arm discomfort.  ?She will use warm compresses and continue to wear the compression bra for the hematoma in the right breast. ? ?11/10/2021 ?--Dr. Autumn Messing ?RIGHT BREAST  LUMPECTOMY WITH RADIOACTIVE SEED LOCALIZATION  ?DEEP RIGHT AXILLARY SENTINEL LYMPH NODE BIOPSY  ? ?Past/Anticipated interventions by medical oncology, if any:  ?Under care of Dr. Nicholas Lose ?11/25/2021 ?--Treatment plan: ?Oncotype DX testing to determine if chemotherapy would be of any benefit followed by ?11/30/2021 Varney Biles Martini-Navigator: "Received oncotype results of 15/4%. Spoke with patient and she is aware" ?Adjuvant radiation therapy followed by ?Adjuvant antiestrogen therapy ?--Return to clinic based upon Oncotype DX test result ? ?Lymphedema issues, if any:  Yes states that she has a  hematoma    ? ?Pain issues, if any:  Some in breast pain  ? ?SAFETY ISSUES: ?Prior radiation? No ?Pacemaker/ICD? No ?Possible current pregnancy?No ?Is the patient on methotrexate? No ? ?Current Complaints / other details:   ?Vitals:  ? 12/08/21 0839  ?BP: 124/75  ?Pulse: 69  ?Resp: 18  ?Temp: (!) 96.8 ?F (36 ?C)  ?TempSrc: Temporal  ?SpO2: 100%  ?Weight: 56.7 kg  ?Height: 5' 4"  (1.626 m)  ? ? ?   ? ? ? ?

## 2021-12-15 ENCOUNTER — Ambulatory Visit: Payer: No Typology Code available for payment source | Admitting: Radiation Oncology

## 2021-12-15 ENCOUNTER — Ambulatory Visit: Payer: No Typology Code available for payment source

## 2021-12-15 ENCOUNTER — Encounter: Payer: Self-pay | Admitting: *Deleted

## 2021-12-15 DIAGNOSIS — C50411 Malignant neoplasm of upper-outer quadrant of right female breast: Secondary | ICD-10-CM

## 2021-12-15 DIAGNOSIS — M79601 Pain in right arm: Secondary | ICD-10-CM

## 2021-12-15 DIAGNOSIS — R293 Abnormal posture: Secondary | ICD-10-CM

## 2021-12-15 DIAGNOSIS — Z483 Aftercare following surgery for neoplasm: Secondary | ICD-10-CM

## 2021-12-15 NOTE — Therapy (Signed)
OUTPATIENT PHYSICAL THERAPY BREAST CANCER TREATMENT    Patient Name: Kathryn Mckenzie MRN: [id number] DOB:November 01, 1980, 41 y.o., female Today's Date: 12/15/2021   PT End of Session - 12/15/21 1609     Visit Number 3    Number of Visits 10    Date for PT Re-Evaluation 01/03/22    PT Start Time 1610    PT Stop Time 1700    PT Time Calculation (min) 50 min    Activity Tolerance Patient tolerated treatment well    Behavior During Therapy WFL for tasks assessed/performed             Past Medical History:  Diagnosis Date   Anxiety    Breast cancer ([place])    PONV (postoperative nausea and vomiting)    Past Surgical History:  Procedure Laterality Date   BREAST LUMPECTOMY WITH RADIOACTIVE SEED AND SENTINEL LYMPH NODE BIOPSY Right 11/10/2021   Procedure: RIGHT BREAST LUMPECTOMY WITH RADIOACTIVE SEED AND SENTINEL LYMPH NODE BIOPSY;  Surgeon: Jovita Kussmaul, MD;  Location: [facility];  Service: General;  Laterality: Right;   CESAREAN SECTION     TONSILLECTOMY     Patient Active Problem List   Diagnosis Date Noted   Genetic testing 09/20/2021   Family history of breast cancer 09/09/2021   Family history of pancreatic cancer 09/09/2021   Malignant neoplasm of upper-outer quadrant of right breast in female, estrogen receptor positive ([place]) 09/07/2021    PCP: Thedora Hinders, PA  REFERRING PROVIDER: Dr. Autumn Messing  REFERRING DIAG: Right breast cancer  THERAPY DIAG:  Malignant neoplasm of upper-outer quadrant of right breast in female, estrogen receptor positive ([place])  Abnormal posture  Aftercare following surgery for neoplasm  Pain in right arm  ONSET DATE: 11/10/2021  SUBJECTIVE:                                                                                                                                                                                           SUBJECTIVE STATEMENT: I saw the Dr but he did not drain the hematoma because it was too thick. I am getting  conflicting info from the surgeon and the radiologist about radiation with the hematoma. I haven't been wearing the compression bra all the time, but I have been wearing it at night and wearing the foam pad that Inez Catalina gave me. The MD does not want any massage done to the Hematoma area. I feel like the cording is better. I am feeling more strain than pain. I am scheduled for radiation on 12/27/2021   PERTINENT HISTORY:  Patient was diagnosed on 08/30/2021 with right grade I invasive  ductal carcinoma breast cancer. She underwent a right lumpectomy and sentinel node biopsy (8 negative nodes) on 11/10/2021. It is ER/PR positive and HER2 negative with a Ki67 of 1%.    PATIENT GOALS:  Reassess how my recovery is going related to arm function, pain, and swelling.  PAIN:  Are you having pain? no: NPRS scale: 0/10 Pain location: right shoulder and right chest and axilla Pain description: tingling and burning; arm feels asleep Aggravating factors: Activity Relieving factors: Being still  PRECAUTIONS: Recent Surgery, right UE Lymphedema risk  ACTIVITY LEVEL / LEISURE: Pure Barre 3-4x/week but has not returned to this yet. She has not resumed walking.   OBJECTIVE:   PATIENT SURVEYS:  QUICK DASH:     OBSERVATIONS:  Visible axillary cording present on right side. Significant hematoma present on right upper outer breast. She sees her surgeon tomorrow and plans to ask about the possibility of draining the hematoma. Incision appears to be healing well with glue still present.  POSTURE:  Forward head and mild rounded shoulders posture   LYMPHEDEMA ASSESSMENT:   UPPER EXTREMITY AROM/PROM:   A/PROM RIGHT  09/08/2021   RIGHT 12/06/2021 12/15/2021  Shoulder extension 57 34 50  Shoulder flexion 157 127 157  Shoulder abduction 160 129 173  Shoulder internal rotation 65 73   Shoulder external rotation 90 88 110                          (Blank rows = not tested)   A/PROM LEFT  09/08/2021  Shoulder  extension 61  Shoulder flexion 146  Shoulder abduction 169  Shoulder internal rotation 71  Shoulder external rotation 85                          (Blank rows = not tested)     CERVICAL AROM: All within normal limits   UPPER EXTREMITY STRENGTH: WFL     LYMPHEDEMA ASSESSMENTS:    LANDMARK RIGHT  09/08/2021 RIGHT 12/06/2021  10 cm proximal to olecranon process 26.6 25.6  Olecranon process 22.6 22.6  10 cm proximal to ulnar styloid process 20.5 20.2  Just proximal to ulnar styloid process 14.4 14  Across hand at thumb web space 18.2 18.1  At base of 2nd digit 6 5.9  (Blank rows = not tested)   LANDMARK LEFT  09/08/2021 LEFT 12/06/2021  10 cm proximal to olecranon process 26.7 26.2  Olecranon process 22.4 22.3  10 cm proximal to ulnar styloid process 20 20  Just proximal to ulnar styloid process 13.9 13.7  Across hand at thumb web space 17.7 17.8  At base of 2nd digit 5.9 5.9  (Blank rows = not tested)     Surgery type/Date: 11/10/2021 right lumpectomy and sentinel node biopsy Number of lymph nodes removed: 8 Current/past treatment (chemo, radiation, hormone therapy): none Other symptoms:  Heaviness/tightness Yes Pain Yes Pitting edema Yes Infections No Decreased scar mobility Yes Stemmer sign No     PATIENT EDUCATION:  Education details: HEP Person educated: Patient Education method: Consulting civil engineer, Media planner, and Handouts Education comprehension: verbalized understanding and returned demonstration   HOME EXERCISE PROGRAM:  Reviewed previously given post op HEP.  TREATMENT 12/15/2021 Supine wand flexion and scaption x 3 ea MFR to right axilla, upper arm and forearm areas of cording PROM Right shoulder flex, abd, IR and ER Pts AROM assessed post treatment   12/06/2021 PT applied compression foam (inside a stockinette) to  her right chest between her bra and skin to help support and reduce edema present in the right upper outer chest.  ASSESSMENT:  CLINICAL  IMPRESSION: Pt has made excellent progress with AROM and is limited only with shoulder extension, and IR was not assessed today. She has achieved full motion or exceeded pre-surgery ROM measurements.  Cording is still present but not limiting ROM. Hematoma is still present but MD does not want her having massage to that area.   Pt will benefit from skilled therapeutic intervention to improve on the following deficits: Decreased knowledge of precautions, impaired UE functional use, pain, decreased ROM, postural dysfunction.   PT treatment/interventions: ADL/Self care home management, Therapeutic exercises, Therapeutic activity, Neuromuscular re-education, Balance training, Gait training, Patient/Family education, Joint mobilization, Manual lymph drainage, scar mobilization, and Manual therapy     GOALS: Goals reviewed with patient? Yes  LONG TERM GOALS:  (STG=LTG)  GOALS Name Target Date (Remove Blue Hyperlink) Goal status  1 Pt will demonstrate she has regained full shoulder ROM and function post operatively compared to baselines.   01/03/2022 IN PROGRESS  2 Patient will increase right shoulder flexion and abduction to >/= 155 degrees for increased ability to reach overhead and obtain radiation positioning. 01/12/2022 MET 12/15/2021  3 Patient will improve her DASH score to be </= 10 for improved overall UE function. 01/12/2022 INITIAL  4 Patient will report >/= 50% reduction in right chest edema and pain to tolerate daily tasks including exercise. 01/12/2022 INITIAL  5 Patient will verbalize good understanding of lymphedema risk reduction practices. 01/03/2022 INITIAL     PLAN: PT FREQUENCY/DURATION: 2x/week for 4 weeks  PLAN FOR NEXT SESSION: Assess right chest hematoma and begin gentle massage if MD permits (per pt, MD said no to massage). Begin PROM and AAROM shoulder exercises and postural exercises. Start gentle strength with TB  Cheral Almas, PT 12/15/21 5:12 PM

## 2021-12-17 ENCOUNTER — Telehealth: Payer: Self-pay | Admitting: Hematology and Oncology

## 2021-12-17 NOTE — Telephone Encounter (Signed)
.  Called patient to schedule appointment per 5/3 inbasket, patient is aware of date and time.   ?

## 2021-12-21 ENCOUNTER — Other Ambulatory Visit: Payer: Self-pay

## 2021-12-21 ENCOUNTER — Ambulatory Visit
Admission: RE | Admit: 2021-12-21 | Discharge: 2021-12-21 | Disposition: A | Discharge status: Home / Self Care | Payer: No Typology Code available for payment source | Source: Ambulatory Visit | Attending: Radiation Oncology | Admitting: Radiation Oncology

## 2021-12-21 DIAGNOSIS — C50411 Malignant neoplasm of upper-outer quadrant of right female breast: Secondary | ICD-10-CM | POA: Diagnosis not present

## 2021-12-21 DIAGNOSIS — Z170 Estrogen receptor positive status [ER+]: Secondary | ICD-10-CM | POA: Insufficient documentation

## 2021-12-21 LAB — PREGNANCY, URINE: Preg Test, Ur: NEGATIVE

## 2021-12-22 ENCOUNTER — Ambulatory Visit: Payer: No Typology Code available for payment source

## 2021-12-22 DIAGNOSIS — Z170 Estrogen receptor positive status [ER+]: Secondary | ICD-10-CM

## 2021-12-22 DIAGNOSIS — R293 Abnormal posture: Secondary | ICD-10-CM

## 2021-12-22 DIAGNOSIS — C50411 Malignant neoplasm of upper-outer quadrant of right female breast: Secondary | ICD-10-CM | POA: Diagnosis not present

## 2021-12-22 DIAGNOSIS — M79601 Pain in right arm: Secondary | ICD-10-CM

## 2021-12-22 DIAGNOSIS — Z483 Aftercare following surgery for neoplasm: Secondary | ICD-10-CM

## 2021-12-22 NOTE — Therapy (Signed)
OUTPATIENT PHYSICAL THERAPY BREAST CANCER TREATMENT    Patient Name: Kathryn Mckenzie MRN: [id number] DOB:1980-08-25, 41 y.o., female Today's Date: 12/22/2021   PT End of Session - 12/22/21 0802     Visit Number 4    Number of Visits 10    Date for PT Re-Evaluation 01/03/22    PT Start Time 0803    PT Stop Time 0855    PT Time Calculation (min) 52 min    Activity Tolerance Patient tolerated treatment well    Behavior During Therapy WFL for tasks assessed/performed             Past Medical History:  Diagnosis Date   Anxiety    Breast cancer ([place])    PONV (postoperative nausea and vomiting)    Past Surgical History:  Procedure Laterality Date   BREAST LUMPECTOMY WITH RADIOACTIVE SEED AND SENTINEL LYMPH NODE BIOPSY Right 11/10/2021   Procedure: RIGHT BREAST LUMPECTOMY WITH RADIOACTIVE SEED AND SENTINEL LYMPH NODE BIOPSY;  Surgeon: Jovita Kussmaul, MD;  Location: [facility];  Service: General;  Laterality: Right;   CESAREAN SECTION     TONSILLECTOMY     Patient Active Problem List   Diagnosis Date Noted   Genetic testing 09/20/2021   Family history of breast cancer 09/09/2021   Family history of pancreatic cancer 09/09/2021   Malignant neoplasm of upper-outer quadrant of right breast in female, estrogen receptor positive ([place]) 09/07/2021    PCP: Thedora Hinders, PA  REFERRING PROVIDER: Dr. Autumn Messing  REFERRING DIAG: Right breast cancer  THERAPY DIAG:  Malignant neoplasm of upper-outer quadrant of right breast in female, estrogen receptor positive ([place])  Abnormal posture  Aftercare following surgery for neoplasm  Pain in right arm  ONSET DATE: 11/10/2021  SUBJECTIVE:                                                                                                                                                                                           SUBJECTIVE STATEMENT: The nerve pain I had down my arm is much better. Pain overall is 75% better. The  hematoma is really improving.  I had CT simulation yesterday. We are doing 15 sessions and 1 session of the entire breast. The cording under my arm is much better, but I still feel it a little in my forearm  PERTINENT HISTORY:  Patient was diagnosed on 08/30/2021 with right grade I invasive ductal carcinoma breast cancer. She underwent a right lumpectomy and sentinel node biopsy (8 negative nodes) on 11/10/2021. It is ER/PR positive and HER2 negative with a Ki67 of 1%.    PATIENT GOALS:  Reassess how my recovery is going related to arm function, pain, and swelling.  PAIN:  Are you having pain? no: NPRS scale: 0/10 Pain location: right shoulder and right chest and axilla Pain description: tingling and burning; arm feels asleep Aggravating factors: Activity Relieving factors: Being still  PRECAUTIONS: Recent Surgery, right UE Lymphedema risk  ACTIVITY LEVEL / LEISURE: Pure Barre 3-4x/week but has not returned to this yet. She has not resumed walking.   OBJECTIVE:   PATIENT SURVEYS:  QUICK DASH:     OBSERVATIONS:  Visible axillary cording present on right side. Significant hematoma present on right upper outer breast. She sees her surgeon tomorrow and plans to ask about the possibility of draining the hematoma. Incision appears to be healing well with glue still present.  POSTURE:  Forward head and mild rounded shoulders posture   LYMPHEDEMA ASSESSMENT:   UPPER EXTREMITY AROM/PROM:   A/PROM RIGHT  09/08/2021   RIGHT 12/06/2021 12/15/2021  Shoulder extension 57 34 50  Shoulder flexion 157 127 157  Shoulder abduction 160 129 173  Shoulder internal rotation 65 73   Shoulder external rotation 90 88 110                          (Blank rows = not tested)   A/PROM LEFT  09/08/2021  Shoulder extension 61  Shoulder flexion 146  Shoulder abduction 169  Shoulder internal rotation 71  Shoulder external rotation 85                          (Blank rows = not tested)     CERVICAL  AROM: All within normal limits   UPPER EXTREMITY STRENGTH: WFL     LYMPHEDEMA ASSESSMENTS:    LANDMARK RIGHT  09/08/2021 RIGHT 12/06/2021  10 cm proximal to olecranon process 26.6 25.6  Olecranon process 22.6 22.6  10 cm proximal to ulnar styloid process 20.5 20.2  Just proximal to ulnar styloid process 14.4 14  Across hand at thumb web space 18.2 18.1  At base of 2nd digit 6 5.9  (Blank rows = not tested)   LANDMARK LEFT  09/08/2021 LEFT 12/06/2021  10 cm proximal to olecranon process 26.7 26.2  Olecranon process 22.4 22.3  10 cm proximal to ulnar styloid process 20 20  Just proximal to ulnar styloid process 13.9 13.7  Across hand at thumb web space 17.7 17.8  At base of 2nd digit 5.9 5.9  (Blank rows = not tested)     Surgery type/Date: 11/10/2021 right lumpectomy and sentinel node biopsy Number of lymph nodes removed: 8 Current/past treatment (chemo, radiation, hormone therapy): none Other symptoms:  Heaviness/tightness Yes Pain Yes Pitting edema Yes Infections No Decreased scar mobility Yes Stemmer sign No     PATIENT EDUCATION:  Education details: HEP Person educated: Patient Education method: Consulting civil engineer, Media planner, and Handouts Education comprehension: verbalized understanding and returned demonstration   HOME EXERCISE PROGRAM:  Reviewed previously given post op HEP.  TREATMENT 12/22/2021 Pulleys for flexion 2 min, scaption x 1 min, abduction x 2 min Vcs for proper form Ball rolls flexion x 10, abduction x 5 Lower trunk rotation x 5 with outstetched arms and goal post arms Soft tissue mobilization to right pectorals, UT, lats in supine and in SL to UT, scapular area, and lats with cocoa butter. MFR to right axilla and arm areas of cording. PROM right shoulder flexion, scaption, abduction, ER  12/15/2021 Supine wand  flexion and scaption x 3 ea MFR to right axilla, upper arm and forearm areas of cording PROM Right shoulder flex, abd, IR and ER Pts  AROM assessed post treatment   12/06/2021 PT applied compression foam (inside a stockinette) to her right chest between her bra and skin to help support and reduce edema present in the right upper outer chest.  ASSESSMENT:  CLINICAL IMPRESSION: Pts pain is 75% better overall. She does note some compensation with soreness in the back area addressed with soft tissue today. She did well with new exercises, and continues to improve. Area of seroma is softer and smaller.  Pt will benefit from skilled therapeutic intervention to improve on the following deficits: Decreased knowledge of precautions, impaired UE functional use, pain, decreased ROM, postural dysfunction.   PT treatment/interventions: ADL/Self care home management, Therapeutic exercises, Therapeutic activity, Neuromuscular re-education, Balance training, Gait training, Patient/Family education, Joint mobilization, Manual lymph drainage, scar mobilization, and Manual therapy     GOALS: Goals reviewed with patient? Yes  LONG TERM GOALS:  (STG=LTG)  GOALS Name Target Date (Remove Blue Hyperlink) Goal status  1 Pt will demonstrate she has regained full shoulder ROM and function post operatively compared to baselines.   01/03/2022 IN PROGRESS  2 Patient will increase right shoulder flexion and abduction to >/= 155 degrees for increased ability to reach overhead and obtain radiation positioning. 01/19/2022 MET 12/15/2021  3 Patient will improve her DASH score to be </= 10 for improved overall UE function. 01/19/2022 INITIAL  4 Patient will report >/= 50% reduction in right chest edema and pain to tolerate daily tasks including exercise. 01/19/2022 INITIAL  5 Patient will verbalize good understanding of lymphedema risk reduction practices. 01/03/2022 INITIAL     PLAN: PT FREQUENCY/DURATION: 2x/week for 4 weeks  PLAN FOR NEXT SESSION: Assess right chest hematoma and begin gentle massage if MD permits (per pt, MD said no to massage).  Begin PROM and AAROM shoulder exercises and postural exercises. Start gentle strength with TB  Cheral Almas, PT 12/22/21 8:59 AM

## 2021-12-27 ENCOUNTER — Ambulatory Visit: Payer: No Typology Code available for payment source | Admitting: Radiation Oncology

## 2021-12-28 ENCOUNTER — Encounter: Payer: Self-pay | Admitting: Rehabilitation

## 2021-12-28 ENCOUNTER — Ambulatory Visit: Payer: No Typology Code available for payment source

## 2021-12-28 ENCOUNTER — Ambulatory Visit: Payer: No Typology Code available for payment source | Attending: General Surgery | Admitting: Rehabilitation

## 2021-12-28 DIAGNOSIS — C50411 Malignant neoplasm of upper-outer quadrant of right female breast: Secondary | ICD-10-CM | POA: Insufficient documentation

## 2021-12-28 DIAGNOSIS — R293 Abnormal posture: Secondary | ICD-10-CM | POA: Diagnosis present

## 2021-12-28 DIAGNOSIS — Z510 Encounter for antineoplastic radiation therapy: Secondary | ICD-10-CM | POA: Diagnosis present

## 2021-12-28 DIAGNOSIS — Z170 Estrogen receptor positive status [ER+]: Secondary | ICD-10-CM | POA: Insufficient documentation

## 2021-12-28 DIAGNOSIS — M79601 Pain in right arm: Secondary | ICD-10-CM | POA: Insufficient documentation

## 2021-12-28 DIAGNOSIS — Z483 Aftercare following surgery for neoplasm: Secondary | ICD-10-CM | POA: Insufficient documentation

## 2021-12-28 NOTE — Therapy (Signed)
OUTPATIENT PHYSICAL THERAPY BREAST CANCER TREATMENT    Patient Name: Kathryn Mckenzie MRN: [id number] DOB:March 21, 1981, 41 y.o., female Today's Date: 12/28/2021   PT End of Session - 12/28/21 2037     Visit Number 5    Number of Visits 10    Date for PT Re-Evaluation 01/03/22    PT Start Time 1600    PT Stop Time 1653    PT Time Calculation (min) 53 min              Past Medical History:  Diagnosis Date   Anxiety    Breast cancer ([place])    PONV (postoperative nausea and vomiting)    Past Surgical History:  Procedure Laterality Date   BREAST LUMPECTOMY WITH RADIOACTIVE SEED AND SENTINEL LYMPH NODE BIOPSY Right 11/10/2021   Procedure: RIGHT BREAST LUMPECTOMY WITH RADIOACTIVE SEED AND SENTINEL LYMPH NODE BIOPSY;  Surgeon: Jovita Kussmaul, MD;  Location: [facility];  Service: General;  Laterality: Right;   CESAREAN SECTION     TONSILLECTOMY     Patient Active Problem List   Diagnosis Date Noted   Genetic testing 09/20/2021   Family history of breast cancer 09/09/2021   Family history of pancreatic cancer 09/09/2021   Malignant neoplasm of upper-outer quadrant of right breast in female, estrogen receptor positive ([place]) 09/07/2021    PCP: Thedora Hinders, PA  REFERRING PROVIDER: Dr. Autumn Messing  REFERRING DIAG: Right breast cancer  THERAPY DIAG:  Malignant neoplasm of upper-outer quadrant of right breast in female, estrogen receptor positive ([place])  Abnormal posture  Aftercare following surgery for neoplasm  Pain in right arm  ONSET DATE: 11/10/2021  SUBJECTIVE:                                                                                                                                                                                           SUBJECTIVE STATEMENT: I think I overdid it. I cleaned out some of the basement and I had more soreness around the shoulder blade.  Not as bad now  PERTINENT HISTORY:  Patient was diagnosed on 08/30/2021 with right grade I  invasive ductal carcinoma breast cancer. She underwent a right lumpectomy and sentinel node biopsy (8 negative nodes) on 11/10/2021. It is ER/PR positive and HER2 negative with a Ki67 of 1%.    PATIENT GOALS:  Reassess how my recovery is going related to arm function, pain, and swelling.  PAIN:  Are you having pain? no: NPRS scale: 1-2/10 Pain location: right shoulder inferior scapular region Pain description: sore Aggravating factors: Activity Relieving factors: Being still  PRECAUTIONS: Recent Surgery, right UE Lymphedema risk  ACTIVITY LEVEL / LEISURE: Pure Barre 3-4x/week but has not returned to this yet. She has not resumed walking.   OBJECTIVE:  OBSERVATIONS: Visible axillary cording present on right side. Significant hematoma present on right upper outer breast. She sees her surgeon tomorrow and plans to ask about the possibility of draining the hematoma. Incision appears to be healing well with glue still present.  POSTURE:  Forward head and mild rounded shoulders posture  LYMPHEDEMA ASSESSMENT:   UPPER EXTREMITY AROM/PROM:   A/PROM RIGHT  09/08/2021   RIGHT 12/06/2021 12/15/2021  Shoulder extension 57 34 50  Shoulder flexion 157 127 157  Shoulder abduction 160 129 173  Shoulder internal rotation 65 73   Shoulder external rotation 90 88 110                          (Blank rows = not tested)   A/PROM LEFT  09/08/2021  Shoulder extension 61  Shoulder flexion 146  Shoulder abduction 169  Shoulder internal rotation 71  Shoulder external rotation 85                          (Blank rows = not tested)     CERVICAL AROM: All within normal limits   UPPER EXTREMITY STRENGTH: WFL     LYMPHEDEMA ASSESSMENTS:    LANDMARK RIGHT  09/08/2021 RIGHT 12/06/2021  10 cm proximal to olecranon process 26.6 25.6  Olecranon process 22.6 22.6  10 cm proximal to ulnar styloid process 20.5 20.2  Just proximal to ulnar styloid process 14.4 14  Across hand at thumb web space 18.2 18.1   At base of 2nd digit 6 5.9  (Blank rows = not tested)   LANDMARK LEFT  09/08/2021 LEFT 12/06/2021  10 cm proximal to olecranon process 26.7 26.2  Olecranon process 22.4 22.3  10 cm proximal to ulnar styloid process 20 20  Just proximal to ulnar styloid process 13.9 13.7  Across hand at thumb web space 17.7 17.8  At base of 2nd digit 5.9 5.9  (Blank rows = not tested)     Surgery type/Date: 11/10/2021 right lumpectomy and sentinel node biopsy Number of lymph nodes removed: 8 Current/past treatment (chemo, radiation, hormone therapy): none Other symptoms:  Heaviness/tightness Yes Pain Yes Pitting edema Yes Infections No Decreased scar mobility Yes Stemmer sign No  PATIENT EDUCATION:  Education details: HEP Person educated: Patient Education method: Consulting civil engineer, Media planner, and Handouts Education comprehension: verbalized understanding and returned demonstration   HOME EXERCISE PROGRAM:  Reviewed previously given post op HEP.  TREATMENT 12/28/21 Pulleys for flexion 2 min,abduction x 2 min Ball rolls flexion x 5, abduction x 5 Supine scap series yellow band with pictures given to pt for HEP  Horizontal abduction x 10  Bil ER yellow x 10  Flexion narrow x 10   Diagonal x 10 bil yellow MFR to right axilla and arm without sig cording noted PROM right shoulder flexion, scaption, abduction, ER Soft tissue mobilization to right pectorals, UT, lats in supine and in SL to UT, scapular area, and lats with cocoa butter. Discussed how soreness is similar to doing a workout again for the first time after housework this weekend.  Discussed how pt could modify barre class to include no full planks or pushups and maybe wall  12/22/2021 Pulleys for flexion 2 min, scaption x 1 min, abduction x 2 min Vcs for proper form Ball rolls flexion x 10, abduction  x 5 Lower trunk rotation x 5 with outstetched arms and goal post arms Soft tissue mobilization to right pectorals, UT, lats in supine  and in SL to UT, scapular area, and lats with cocoa butter. MFR to right axilla and arm areas of cording. PROM right shoulder flexion, scaption, abduction, ER  12/15/2021 Supine wand flexion and scaption x 3 ea MFR to right axilla, upper arm and forearm areas of cording PROM Right shoulder flex, abd, IR and ER Pts AROM assessed post treatment  ASSESSMENT: CLINICAL IMPRESSION: Pt has full AROM compared to the left side today on active screening today.  She does note some compensation with soreness in the back area addressed with soft tissue today. She did well with new exercises and feels ready for DC after next visit as radiation also starts next week.    Pt will benefit from skilled therapeutic intervention to improve on the following deficits: Decreased knowledge of precautions, impaired UE functional use, pain, decreased ROM, postural dysfunction.   PT treatment/interventions: ADL/Self care home management, Therapeutic exercises, Therapeutic activity, Neuromuscular re-education, Balance training, Gait training, Patient/Family education, Joint mobilization, Manual lymph drainage, scar mobilization, and Manual therapy     GOALS: Goals reviewed with patient? Yes  LONG TERM GOALS:  (STG=LTG)  GOALS Name Target Date (Remove Blue Hyperlink) Goal status  1 Pt will demonstrate she has regained full shoulder ROM and function post operatively compared to baselines.   01/03/2022 IN PROGRESS  2 Patient will increase right shoulder flexion and abduction to >/= 155 degrees for increased ability to reach overhead and obtain radiation positioning. 01/25/2022 MET 12/15/2021  3 Patient will improve her DASH score to be </= 10 for improved overall UE function. 01/25/2022 INITIAL  4 Patient will report >/= 50% reduction in right chest edema and pain to tolerate daily tasks including exercise. 01/25/2022 INITIAL  5 Patient will verbalize good understanding of lymphedema risk reduction practices. 01/03/2022  INITIAL     PLAN: PT FREQUENCY/DURATION: 2x/week for 4 weeks  PLAN FOR NEXT SESSION: goal check, DC visit, final MT/TE  Shan Levans, PT  12/28/21 8:38 PM

## 2021-12-28 NOTE — Patient Instructions (Signed)

## 2021-12-29 ENCOUNTER — Ambulatory Visit: Payer: No Typology Code available for payment source

## 2021-12-30 ENCOUNTER — Other Ambulatory Visit: Payer: Self-pay

## 2021-12-30 ENCOUNTER — Ambulatory Visit: Payer: No Typology Code available for payment source

## 2021-12-30 ENCOUNTER — Ambulatory Visit
Admission: RE | Admit: 2021-12-30 | Discharge: 2021-12-30 | Disposition: A | Discharge status: Home / Self Care | Payer: No Typology Code available for payment source | Source: Ambulatory Visit | Attending: Radiation Oncology | Admitting: Radiation Oncology

## 2021-12-30 DIAGNOSIS — C50411 Malignant neoplasm of upper-outer quadrant of right female breast: Secondary | ICD-10-CM

## 2021-12-30 DIAGNOSIS — R293 Abnormal posture: Secondary | ICD-10-CM

## 2021-12-30 DIAGNOSIS — Z483 Aftercare following surgery for neoplasm: Secondary | ICD-10-CM

## 2021-12-30 DIAGNOSIS — M79601 Pain in right arm: Secondary | ICD-10-CM

## 2021-12-30 DIAGNOSIS — Z510 Encounter for antineoplastic radiation therapy: Secondary | ICD-10-CM | POA: Diagnosis not present

## 2021-12-30 LAB — RAD ONC ARIA SESSION SUMMARY
Course Elapsed Days: 0
Plan Fractions Treated to Date: 1
Plan Prescribed Dose Per Fraction: 2.66 Gy
Plan Total Fractions Prescribed: 16
Plan Total Prescribed Dose: 42.56 Gy
Reference Point Dosage Given to Date: 2.66 Gy
Reference Point Session Dosage Given: 2.66 Gy
Session Number: 1

## 2021-12-30 NOTE — Therapy (Signed)
OUTPATIENT PHYSICAL THERAPY BREAST CANCER TREATMENT    Patient Name: Kathryn Mckenzie MRN: [id number] DOB:02-24-1981, 41 y.o., female Today's Date: 12/30/2021   PT End of Session - 12/30/21 0804     Visit Number 6    Number of Visits 10    Date for PT Re-Evaluation 01/03/22    PT Start Time 0805    PT Stop Time 0856    PT Time Calculation (min) 51 min    Activity Tolerance Patient tolerated treatment well    Behavior During Therapy WFL for tasks assessed/performed              Past Medical History:  Diagnosis Date   Anxiety    Breast cancer ([place])    PONV (postoperative nausea and vomiting)    Past Surgical History:  Procedure Laterality Date   BREAST LUMPECTOMY WITH RADIOACTIVE SEED AND SENTINEL LYMPH NODE BIOPSY Right 11/10/2021   Procedure: RIGHT BREAST LUMPECTOMY WITH RADIOACTIVE SEED AND SENTINEL LYMPH NODE BIOPSY;  Surgeon: Jovita Kussmaul, MD;  Location: [facility];  Service: General;  Laterality: Right;   CESAREAN SECTION     TONSILLECTOMY     Patient Active Problem List   Diagnosis Date Noted   Genetic testing 09/20/2021   Family history of breast cancer 09/09/2021   Family history of pancreatic cancer 09/09/2021   Malignant neoplasm of upper-outer quadrant of right breast in female, estrogen receptor positive ([place]) 09/07/2021    PCP: Thedora Hinders, PA  REFERRING PROVIDER: Dr. Autumn Messing  REFERRING DIAG: Right breast cancer  THERAPY DIAG:  Malignant neoplasm of upper-outer quadrant of right breast in female, estrogen receptor positive ([place])  Abnormal posture  Aftercare following surgery for neoplasm  Pain in right arm  ONSET DATE: 11/10/2021  SUBJECTIVE:                                                                                                                                                                                           SUBJECTIVE STATEMENT: I was still a little sore yesterday as the day went on. Its soreness in my posterior  arm off and on. Its not numbness and its intermittent. I start radiation today. I do think the hematoma has gone down a lot.  PERTINENT HISTORY:  Patient was diagnosed on 08/30/2021 with right grade I invasive ductal carcinoma breast cancer. She underwent a right lumpectomy and sentinel node biopsy (8 negative nodes) on 11/10/2021. It is ER/PR positive and HER2 negative with a Ki67 of 1%.    PATIENT GOALS:  Reassess how my recovery is going related to arm function, pain, and swelling.  PAIN:  Are you having pain? no: NPRS scale: discomfort today not pain. Pain location: right breast incision/hematoma area Pain description: sore Aggravating factors: Activity Relieving factors: Being still  PRECAUTIONS: Recent Surgery, right UE Lymphedema risk  ACTIVITY LEVEL / LEISURE: Pure Barre 3-4x/week but has not returned to this yet. She has not resumed walking.   OBJECTIVE:  OBSERVATIONS: Visible axillary cording present on right side. Significant hematoma present on right upper outer breast. She sees her surgeon tomorrow and plans to ask about the possibility of draining the hematoma. Incision appears to be healing well with glue still present. 6/823 no longer has cording  POSTURE:  Forward head and mild rounded shoulders posture  LYMPHEDEMA ASSESSMENT:   UPPER EXTREMITY AROM/PROM:   A/PROM RIGHT  09/08/2021   RIGHT 12/06/2021 12/15/2021 12/30/2021  Shoulder extension 57 34 50 60  Shoulder flexion 157 127 157 164  Shoulder abduction 160 129 173 174  Shoulder internal rotation 65 73  80  Shoulder external rotation 90 88 110 110                          (Blank rows = not tested)   A/PROM LEFT  09/08/2021  Shoulder extension 61  Shoulder flexion 146  Shoulder abduction 169  Shoulder internal rotation 71  Shoulder external rotation 85                          (Blank rows = not tested)     CERVICAL AROM: All within normal limits   UPPER EXTREMITY STRENGTH: WFL     LYMPHEDEMA  ASSESSMENTS:    LANDMARK RIGHT  09/08/2021 RIGHT 12/06/2021 12/30/2021  10 cm proximal to olecranon process 26.6 25.6 25.8  Olecranon process 22.6 22.6 22.4  10 cm proximal to ulnar styloid process 20.5 20.2 20.0  Just proximal to ulnar styloid process 14.4 14 14.2  Across hand at thumb web space 18.2 18.1 18.5  At base of 2nd digit 6 5.9 5.9  (Blank rows = not tested)   LANDMARK LEFT  09/08/2021 LEFT 12/06/2021  10 cm proximal to olecranon process 26.7 26.2  Olecranon process 22.4 22.3  10 cm proximal to ulnar styloid process 20 20  Just proximal to ulnar styloid process 13.9 13.7  Across hand at thumb web space 17.7 17.8  At base of 2nd digit 5.9 5.9  (Blank rows = not tested)     Surgery type/Date: 11/10/2021 right lumpectomy and sentinel node biopsy Number of lymph nodes removed: 8 Current/past treatment (chemo, radiation, hormone therapy): none Other symptoms:  Heaviness/tightness Yes Pain Yes Pitting edema Yes Infections No Decreased scar mobility Yes Stemmer sign No  PATIENT EDUCATION:  Education details: HEP Person educated: Patient Education method: Consulting civil engineer, Media planner, and Handouts Education comprehension: verbalized understanding and returned demonstration   HOME EXERCISE PROGRAM:  Reviewed previously given post op HEP.  TREATMENT 12/30/2021 Pulleys for flexion 2 min,abduction x 2 min Ball rolls flexion x 5, abduction x 5 Remeasured ROM and circumference to check goals Measured pts arm for compression sleeve and pt ordered Harmony size 1 class 1 Beige on CG Discussed precautions for lymphedema and radiation  12/28/21 Pulleys for flexion 2 min,abduction x 2 min Ball rolls flexion x 5, abduction x 5 Supine scap series yellow band with pictures given to pt for HEP  Horizontal abduction x 10  Bil ER yellow x 10  Flexion narrow x 10   Diagonal x 10  bil yellow MFR to right axilla and arm without sig cording noted PROM right shoulder flexion, scaption,  abduction, ER Soft tissue mobilization to right pectorals, UT, lats in supine and in SL to UT, scapular area, and lats with cocoa butter. Discussed how soreness is similar to doing a workout again for the first time after housework this weekend.  Discussed how pt could modify barre class to include no full planks or pushups and maybe wall  12/22/2021 Pulleys for flexion 2 min, scaption x 1 min, abduction x 2 min Vcs for proper form Ball rolls flexion x 10, abduction x 5 Lower trunk rotation x 5 with outstetched arms and goal post arms Soft tissue mobilization to right pectorals, UT, lats in supine and in SL to UT, scapular area, and lats with cocoa butter. MFR to right axilla and arm areas of cording. PROM right shoulder flexion, scaption, abduction, ER  12/15/2021 Supine wand flexion and scaption x 3 ea MFR to right axilla, upper arm and forearm areas of cording PROM Right shoulder flex, abd, IR and ER Pts AROM assessed post treatment  ASSESSMENT: CLINICAL IMPRESSION: Pt has achieved all goals established except for quick dash however she has made excellent improvement with function. She is starting radiation and knows to continue with her stretches. She will contact us with questions or concerns and she knows she can return at any time prn.  Pt will benefit from skilled therapeutic intervention to improve on the following deficits: Decreased knowledge of precautions, impaired UE functional use, pain, decreased ROM, postural dysfunction.   PT treatment/interventions: ADL/Self care home management, Therapeutic exercises, Therapeutic activity, Neuromuscular re-education, Balance training, Gait training, Patient/Family education, Joint mobilization, Manual lymph drainage, scar mobilization, and Manual therapy     GOALS: Goals reviewed with patient? Yes  LONG TERM GOALS:  (STG=LTG)  GOALS Name Target Date (Remove Blue Hyperlink) Goal status  1 Pt will demonstrate she has regained full  shoulder ROM and function post operatively compared to baselines.   01/03/2022 12/30/2021 MET  2 Patient will increase right shoulder flexion and abduction to >/= 155 degrees for increased ability to reach overhead and obtain radiation positioning. 01/27/2022 MET 12/15/2021  3 Patient will improve her DASH score to be </= 10 for improved overall UE function. 01/27/2022 Not Met 12/30/2021  4 Patient will report >/= 50% reduction in right chest edema and pain to tolerate daily tasks including exercise. 01/27/2022 MET 12/30/2021  5 Patient will verbalize good understanding of lymphedema risk reduction practices. 01/03/2022 MET 12/30/2020     PLAN: PT FREQUENCY/DURATION: 2x/week for 4 weeks  PLAN FOR NEXT SESSION: DC to HEP  PHYSICAL THERAPY DISCHARGE SUMMARY  Visits from Start of Care: 6  Current functional level related to goals / functional outcomes: Achieved all goals except not quite quick dash but feels ready to be released   Remaining deficits: Has not yet resumed barre class.  Hematoma still present   Education / Equipment: Ordered prophylactic sleeve/gauntlet   Patient agrees to discharge. Patient goals were partially met. Patient is being discharged due to being pleased with the current functional level.    Cheral Almas, PT 12/30/21 8:59 AM

## 2021-12-31 ENCOUNTER — Other Ambulatory Visit: Payer: Self-pay

## 2021-12-31 ENCOUNTER — Ambulatory Visit
Admission: RE | Admit: 2021-12-31 | Discharge: 2021-12-31 | Disposition: A | Discharge status: Home / Self Care | Payer: No Typology Code available for payment source | Source: Ambulatory Visit | Attending: Radiation Oncology | Admitting: Radiation Oncology

## 2021-12-31 DIAGNOSIS — C50411 Malignant neoplasm of upper-outer quadrant of right female breast: Secondary | ICD-10-CM

## 2021-12-31 DIAGNOSIS — Z510 Encounter for antineoplastic radiation therapy: Secondary | ICD-10-CM | POA: Diagnosis not present

## 2021-12-31 LAB — RAD ONC ARIA SESSION SUMMARY
Course Elapsed Days: 1
Plan Fractions Treated to Date: 2
Plan Prescribed Dose Per Fraction: 2.66 Gy
Plan Total Fractions Prescribed: 16
Plan Total Prescribed Dose: 42.56 Gy
Reference Point Dosage Given to Date: 5.32 Gy
Reference Point Session Dosage Given: 2.66 Gy
Session Number: 2

## 2021-12-31 MED ORDER — ALRA NON-METALLIC DEODORANT (RAD-ONC)
1.0000 "application " | Freq: Once | TOPICAL | Status: AC
  Administered 2021-12-31: 1 via TOPICAL

## 2021-12-31 MED ORDER — RADIAPLEXRX EX GEL: Freq: Once | CUTANEOUS | Status: AC

## 2021-12-31 NOTE — Progress Notes (Signed)
Pt here for patient teaching.    Pt given Radiation and You booklet, skin care instructions, Alra deodorant, and Radiaplex gel.    Reviewed areas of pertinence such as fatigue, hair loss in treatment field, skin changes, breast tenderness, and breast swelling .   Pt able to give teach back of to pat skin and use unscented/gentle soap,apply Radiaplex bid, avoid applying anything to skin within 4 hours of treatment, avoid wearing an under wire bra, and to use an electric razor if they must shave.   Pt verbalizes understanding of information given and will contact nursing with any questions or concerns.          

## 2022-01-03 ENCOUNTER — Encounter: Payer: No Typology Code available for payment source | Admitting: Rehabilitation

## 2022-01-03 ENCOUNTER — Other Ambulatory Visit: Payer: Self-pay

## 2022-01-03 ENCOUNTER — Ambulatory Visit
Admission: RE | Admit: 2022-01-03 | Discharge: 2022-01-03 | Disposition: A | Discharge status: Home / Self Care | Payer: No Typology Code available for payment source | Source: Ambulatory Visit | Attending: Radiation Oncology | Admitting: Radiation Oncology

## 2022-01-03 DIAGNOSIS — Z510 Encounter for antineoplastic radiation therapy: Secondary | ICD-10-CM | POA: Diagnosis not present

## 2022-01-03 LAB — RAD ONC ARIA SESSION SUMMARY
Course Elapsed Days: 4
Plan Fractions Treated to Date: 3
Plan Prescribed Dose Per Fraction: 2.66 Gy
Plan Total Fractions Prescribed: 16
Plan Total Prescribed Dose: 42.56 Gy
Reference Point Dosage Given to Date: 7.98 Gy
Reference Point Session Dosage Given: 2.66 Gy
Session Number: 3

## 2022-01-04 ENCOUNTER — Other Ambulatory Visit: Payer: Self-pay

## 2022-01-04 ENCOUNTER — Ambulatory Visit
Admission: RE | Admit: 2022-01-04 | Discharge: 2022-01-04 | Disposition: A | Discharge status: Home / Self Care | Payer: No Typology Code available for payment source | Source: Ambulatory Visit | Attending: Radiation Oncology | Admitting: Radiation Oncology

## 2022-01-04 DIAGNOSIS — Z510 Encounter for antineoplastic radiation therapy: Secondary | ICD-10-CM | POA: Diagnosis not present

## 2022-01-04 LAB — RAD ONC ARIA SESSION SUMMARY
Course Elapsed Days: 5
Plan Fractions Treated to Date: 4
Plan Prescribed Dose Per Fraction: 2.66 Gy
Plan Total Fractions Prescribed: 16
Plan Total Prescribed Dose: 42.56 Gy
Reference Point Dosage Given to Date: 10.64 Gy
Reference Point Session Dosage Given: 2.66 Gy
Session Number: 4

## 2022-01-04 NOTE — Progress Notes (Incomplete)
Patient Care Team: Holland Commons, FNP as PCP - General (Internal Medicine) Jovita Kussmaul, MD as Consulting Physician (General Surgery) Nicholas Lose, MD as Consulting Physician (Hematology and Oncology) Eppie Gibson, MD as Attending Physician (Radiation Oncology)  DIAGNOSIS: No diagnosis found.  SUMMARY OF ONCOLOGIC HISTORY: Oncology History  Malignant neoplasm of upper-outer quadrant of right breast in female, estrogen receptor positive ([place])  09/02/2021 Initial Diagnosis   Palpable right breast lump for 2 to 3 months UOQ.  Mammogram and ultrasound: 10:00: 1.2 cm mass, axilla negative, biopsy: Grade 1 IDC with DCIS (inside a fibroadenoma) ER 90%, PR 100%, HER2 negative, Ki-67 1%   09/08/2021 Cancer Staging   Staging form: Breast, AJCC 8th Edition - Clinical stage from 09/08/2021: Stage IA (cT1c, cN0, cM0, G1, ER+, PR+, HER2-) - Signed by Nicholas Lose, MD on 09/08/2021 Stage prefix: Initial diagnosis Histologic grading system: 3 grade system    Genetic Testing   [facility] CancerNext-Expanded is Negative. Report date is 09/17/2021.  The CancerNext-Expanded gene panel offered by [facility] and includes sequencing, rearrangement, and RNA analysis for the following 77 genes: AIP, ALK, APC, ATM, AXIN2, BAP1, BARD1, BLM, BMPR1A, BRCA1, BRCA2, BRIP1, CDC73, CDH1, CDK4, CDKN1B, CDKN2A, CHEK2, CTNNA1, DICER1, FANCC, FH, FLCN, GALNT12, KIF1B, LZTR1, MAX, MEN1, MET, MLH1, MSH2, MSH3, MSH6, MUTYH, NBN, NF1, NF2, NTHL1, PALB2, PHOX2B, PMS2, POT1, PRKAR1A, PTCH1, PTEN, RAD51C, RAD51D, RB1, RECQL, RET, SDHA, SDHAF2, SDHB, SDHC, SDHD, SMAD4, SMARCA4, SMARCB1, SMARCE1, STK11, SUFU, TMEM127, TP53, TSC1, TSC2, VHL and XRCC2 (sequencing and deletion/duplication); EGFR, EGLN1, HOXB13, KIT, MITF, PDGFRA, POLD1, and POLE (sequencing only); EPCAM and GREM1 (deletion/duplication only).    11/10/2021 Surgery   Right lumpectomy: Grade 1 IDC 1.6 cm with intermediate grade DCIS, margins negative, 0/8 lymph nodes  negative, ER 90%, PR 100%, HER2 negative 1+, Ki-67 1%     CHIEF COMPLIANT: Follow-up after Oncotype DX test result  INTERVAL HISTORY: MIAMARIE MOLL is a 41 year old above-mentioned history of right breast cancer underwent right lumpectomy. She presents to the clinic today for a follow-up to discuss Oncotype DX test result.   ALLERGIES:  has No Known Allergies.  MEDICATIONS:  Current Outpatient Medications  Medication Sig Dispense Refill   gabapentin (NEURONTIN) 100 MG capsule Take 100 mg by mouth 3 (three) times daily as needed.     sertraline (ZOLOFT) 25 MG tablet Take by mouth.     No current facility-administered medications for this visit.    PHYSICAL EXAMINATION: ECOG PERFORMANCE STATUS: {CHL ONC ECOG PS:[phone]}  There were no vitals filed for this visit. There were no vitals filed for this visit.  BREAST:*** No palpable masses or nodules in either right or left breasts. No palpable axillary supraclavicular or infraclavicular adenopathy no breast tenderness or nipple discharge. (exam performed in the presence of a chaperone)  LABORATORY DATA:  I have reviewed the data as listed    Latest Ref Rng & Units 09/08/2021   12:25 PM  CMP  Glucose 70 - 99 mg/dL 99   BUN 6 - 20 mg/dL 15   Creatinine 0.44 - 1.00 mg/dL 0.97   Sodium 135 - 145 mmol/L 139   Potassium 3.5 - 5.1 mmol/L 4.6   Chloride 98 - 111 mmol/L 105   CO2 22 - 32 mmol/L 28   Calcium 8.9 - 10.3 mg/dL 9.6   Total Protein 6.5 - 8.1 g/dL 7.0   Total Bilirubin 0.3 - 1.2 mg/dL 0.4   Alkaline Phos 38 - 126 U/L 39   AST 15 -  41 U/L 18   ALT 0 - 44 U/L 15     Lab Results  Component Value Date   WBC 5.9 09/08/2021   HGB 12.9 09/08/2021   HCT 38.3 09/08/2021   MCV 95.8 09/08/2021   PLT 239 09/08/2021   NEUTROABS 3.4 09/08/2021    ASSESSMENT & PLAN:  No problem-specific Assessment & Plan notes found for this encounter.    No orders of the defined types were placed in this encounter.  The patient has a  good understanding of the overall plan. she agrees with it. she will call with any problems that may develop before the next visit here. Total time spent: 30 mins including face to face time and time spent for planning, charting and co-ordination of care   Suzzette Righter, [place] 01/04/22  I Gardiner Coins am scribing for Dr. Lindi Adie  ***

## 2022-01-05 ENCOUNTER — Ambulatory Visit
Admission: RE | Admit: 2022-01-05 | Discharge: 2022-01-05 | Disposition: A | Discharge status: Home / Self Care | Payer: No Typology Code available for payment source | Source: Ambulatory Visit | Attending: Radiation Oncology | Admitting: Radiation Oncology

## 2022-01-05 ENCOUNTER — Other Ambulatory Visit: Payer: Self-pay

## 2022-01-05 DIAGNOSIS — Z510 Encounter for antineoplastic radiation therapy: Secondary | ICD-10-CM | POA: Diagnosis not present

## 2022-01-05 LAB — RAD ONC ARIA SESSION SUMMARY
Course Elapsed Days: 6
Plan Fractions Treated to Date: 5
Plan Prescribed Dose Per Fraction: 2.66 Gy
Plan Total Fractions Prescribed: 16
Plan Total Prescribed Dose: 42.56 Gy
Reference Point Dosage Given to Date: 13.3 Gy
Reference Point Session Dosage Given: 2.66 Gy
Session Number: 5

## 2022-01-06 ENCOUNTER — Other Ambulatory Visit: Payer: Self-pay

## 2022-01-06 ENCOUNTER — Ambulatory Visit
Admission: RE | Admit: 2022-01-06 | Discharge: 2022-01-06 | Disposition: A | Discharge status: Home / Self Care | Payer: No Typology Code available for payment source | Source: Ambulatory Visit | Attending: Radiation Oncology | Admitting: Radiation Oncology

## 2022-01-06 DIAGNOSIS — Z510 Encounter for antineoplastic radiation therapy: Secondary | ICD-10-CM | POA: Diagnosis not present

## 2022-01-06 LAB — RAD ONC ARIA SESSION SUMMARY
Course Elapsed Days: 7
Plan Fractions Treated to Date: 6
Plan Prescribed Dose Per Fraction: 2.66 Gy
Plan Total Fractions Prescribed: 16
Plan Total Prescribed Dose: 42.56 Gy
Reference Point Dosage Given to Date: 15.96 Gy
Reference Point Session Dosage Given: 2.66 Gy
Session Number: 6

## 2022-01-07 ENCOUNTER — Ambulatory Visit
Admission: RE | Admit: 2022-01-07 | Discharge: 2022-01-07 | Disposition: A | Discharge status: Home / Self Care | Payer: No Typology Code available for payment source | Source: Ambulatory Visit | Attending: Radiation Oncology | Admitting: Radiation Oncology

## 2022-01-07 ENCOUNTER — Other Ambulatory Visit: Payer: Self-pay

## 2022-01-07 DIAGNOSIS — Z510 Encounter for antineoplastic radiation therapy: Secondary | ICD-10-CM | POA: Diagnosis not present

## 2022-01-07 LAB — RAD ONC ARIA SESSION SUMMARY
Course Elapsed Days: 8
Plan Fractions Treated to Date: 7
Plan Prescribed Dose Per Fraction: 2.66 Gy
Plan Total Fractions Prescribed: 16
Plan Total Prescribed Dose: 42.56 Gy
Reference Point Dosage Given to Date: 18.62 Gy
Reference Point Session Dosage Given: 2.66 Gy
Session Number: 7

## 2022-01-10 ENCOUNTER — Ambulatory Visit
Admission: RE | Admit: 2022-01-10 | Discharge: 2022-01-10 | Disposition: A | Discharge status: Home / Self Care | Payer: No Typology Code available for payment source | Source: Ambulatory Visit | Attending: Radiation Oncology | Admitting: Radiation Oncology

## 2022-01-10 ENCOUNTER — Ambulatory Visit: Payer: No Typology Code available for payment source

## 2022-01-10 ENCOUNTER — Ambulatory Visit: Payer: No Typology Code available for payment source | Admitting: Radiation Oncology

## 2022-01-10 ENCOUNTER — Other Ambulatory Visit: Payer: Self-pay

## 2022-01-10 DIAGNOSIS — Z510 Encounter for antineoplastic radiation therapy: Secondary | ICD-10-CM | POA: Diagnosis not present

## 2022-01-10 LAB — RAD ONC ARIA SESSION SUMMARY
Course Elapsed Days: 11
Plan Fractions Treated to Date: 8
Plan Prescribed Dose Per Fraction: 2.66 Gy
Plan Total Fractions Prescribed: 16
Plan Total Prescribed Dose: 42.56 Gy
Reference Point Dosage Given to Date: 21.28 Gy
Reference Point Session Dosage Given: 2.66 Gy
Session Number: 8

## 2022-01-11 ENCOUNTER — Ambulatory Visit
Admission: RE | Admit: 2022-01-11 | Discharge: 2022-01-11 | Disposition: A | Discharge status: Home / Self Care | Payer: No Typology Code available for payment source | Source: Ambulatory Visit | Attending: Radiation Oncology | Admitting: Radiation Oncology

## 2022-01-11 ENCOUNTER — Other Ambulatory Visit: Payer: Self-pay

## 2022-01-11 ENCOUNTER — Encounter: Payer: Self-pay | Admitting: *Deleted

## 2022-01-11 DIAGNOSIS — C50411 Malignant neoplasm of upper-outer quadrant of right female breast: Secondary | ICD-10-CM

## 2022-01-11 DIAGNOSIS — Z510 Encounter for antineoplastic radiation therapy: Secondary | ICD-10-CM | POA: Diagnosis not present

## 2022-01-11 LAB — RAD ONC ARIA SESSION SUMMARY
Course Elapsed Days: 12
Plan Fractions Treated to Date: 9
Plan Prescribed Dose Per Fraction: 2.66 Gy
Plan Total Fractions Prescribed: 16
Plan Total Prescribed Dose: 42.56 Gy
Reference Point Dosage Given to Date: 23.94 Gy
Reference Point Session Dosage Given: 2.66 Gy
Session Number: 9

## 2022-01-12 ENCOUNTER — Ambulatory Visit
Admission: RE | Admit: 2022-01-12 | Discharge: 2022-01-12 | Disposition: A | Discharge status: Home / Self Care | Payer: No Typology Code available for payment source | Source: Ambulatory Visit | Attending: Radiation Oncology | Admitting: Radiation Oncology

## 2022-01-12 ENCOUNTER — Other Ambulatory Visit: Payer: Self-pay

## 2022-01-12 DIAGNOSIS — Z510 Encounter for antineoplastic radiation therapy: Secondary | ICD-10-CM | POA: Diagnosis not present

## 2022-01-12 LAB — RAD ONC ARIA SESSION SUMMARY
Course Elapsed Days: 13
Plan Fractions Treated to Date: 10
Plan Prescribed Dose Per Fraction: 2.66 Gy
Plan Total Fractions Prescribed: 16
Plan Total Prescribed Dose: 42.56 Gy
Reference Point Dosage Given to Date: 26.6 Gy
Reference Point Session Dosage Given: 2.66 Gy
Session Number: 10

## 2022-01-13 ENCOUNTER — Other Ambulatory Visit: Payer: Self-pay

## 2022-01-13 ENCOUNTER — Ambulatory Visit
Admission: RE | Admit: 2022-01-13 | Discharge: 2022-01-13 | Disposition: A | Discharge status: Home / Self Care | Payer: No Typology Code available for payment source | Source: Ambulatory Visit | Attending: Radiation Oncology | Admitting: Radiation Oncology

## 2022-01-13 DIAGNOSIS — Z510 Encounter for antineoplastic radiation therapy: Secondary | ICD-10-CM | POA: Diagnosis not present

## 2022-01-13 LAB — RAD ONC ARIA SESSION SUMMARY
Course Elapsed Days: 14
Plan Fractions Treated to Date: 11
Plan Prescribed Dose Per Fraction: 2.66 Gy
Plan Total Fractions Prescribed: 16
Plan Total Prescribed Dose: 42.56 Gy
Reference Point Dosage Given to Date: 29.26 Gy
Reference Point Session Dosage Given: 2.66 Gy
Session Number: 11

## 2022-01-14 ENCOUNTER — Other Ambulatory Visit: Payer: Self-pay

## 2022-01-14 ENCOUNTER — Ambulatory Visit
Admission: RE | Admit: 2022-01-14 | Discharge: 2022-01-14 | Disposition: A | Discharge status: Home / Self Care | Payer: No Typology Code available for payment source | Source: Ambulatory Visit | Attending: Radiation Oncology | Admitting: Radiation Oncology

## 2022-01-14 DIAGNOSIS — Z510 Encounter for antineoplastic radiation therapy: Secondary | ICD-10-CM | POA: Diagnosis not present

## 2022-01-14 LAB — RAD ONC ARIA SESSION SUMMARY
Course Elapsed Days: 15
Plan Fractions Treated to Date: 12
Plan Prescribed Dose Per Fraction: 2.66 Gy
Plan Total Fractions Prescribed: 16
Plan Total Prescribed Dose: 42.56 Gy
Reference Point Dosage Given to Date: 31.92 Gy
Reference Point Session Dosage Given: 2.66 Gy
Session Number: 12

## 2022-01-17 ENCOUNTER — Ambulatory Visit
Admission: RE | Admit: 2022-01-17 | Discharge: 2022-01-17 | Disposition: A | Discharge status: Home / Self Care | Payer: No Typology Code available for payment source | Source: Ambulatory Visit | Attending: Radiation Oncology | Admitting: Radiation Oncology

## 2022-01-17 ENCOUNTER — Other Ambulatory Visit: Payer: Self-pay

## 2022-01-17 DIAGNOSIS — Z510 Encounter for antineoplastic radiation therapy: Secondary | ICD-10-CM | POA: Diagnosis not present

## 2022-01-17 LAB — RAD ONC ARIA SESSION SUMMARY
Course Elapsed Days: 18
Plan Fractions Treated to Date: 13
Plan Prescribed Dose Per Fraction: 2.66 Gy
Plan Total Fractions Prescribed: 16
Plan Total Prescribed Dose: 42.56 Gy
Reference Point Dosage Given to Date: 34.58 Gy
Reference Point Session Dosage Given: 2.66 Gy
Session Number: 13

## 2022-01-18 ENCOUNTER — Other Ambulatory Visit: Payer: Self-pay

## 2022-01-18 ENCOUNTER — Inpatient Hospital Stay
Payer: No Typology Code available for payment source | Attending: Hematology and Oncology | Admitting: Hematology and Oncology

## 2022-01-18 ENCOUNTER — Ambulatory Visit
Admission: RE | Admit: 2022-01-18 | Discharge: 2022-01-18 | Disposition: A | Discharge status: Home / Self Care | Payer: No Typology Code available for payment source | Source: Ambulatory Visit | Attending: Radiation Oncology | Admitting: Radiation Oncology

## 2022-01-18 DIAGNOSIS — Z170 Estrogen receptor positive status [ER+]: Secondary | ICD-10-CM | POA: Diagnosis not present

## 2022-01-18 DIAGNOSIS — Z510 Encounter for antineoplastic radiation therapy: Secondary | ICD-10-CM | POA: Diagnosis not present

## 2022-01-18 DIAGNOSIS — C50411 Malignant neoplasm of upper-outer quadrant of right female breast: Secondary | ICD-10-CM | POA: Insufficient documentation

## 2022-01-18 LAB — RAD ONC ARIA SESSION SUMMARY
Course Elapsed Days: 19
Plan Fractions Treated to Date: 14
Plan Prescribed Dose Per Fraction: 2.66 Gy
Plan Total Fractions Prescribed: 16
Plan Total Prescribed Dose: 42.56 Gy
Reference Point Dosage Given to Date: 37.24 Gy
Reference Point Session Dosage Given: 2.66 Gy
Session Number: 14

## 2022-01-18 MED ORDER — TAMOXIFEN CITRATE 20 MG PO TABS: 20.0000 mg | ORAL_TABLET | Freq: Every day | ORAL | 3 refills | Status: DC

## 2022-01-18 MED ORDER — VENLAFAXINE HCL ER 37.5 MG PO CP24: 37.5000 mg | ORAL_CAPSULE | Freq: Every day | ORAL | 3 refills | Status: DC

## 2022-01-18 NOTE — Assessment & Plan Note (Signed)
09/02/2021:Palpable right breast lump for 2 to 3 months UOQ. Mammogram and ultrasound: 10:00: 1.2 cm mass, axilla negative, biopsy: Grade 1 IDC with DCIS (inside a fibroadenoma) ER 90%, PR 100%, HER2 negative, Ki-67 1%  11/10/2021: Right lumpectomy: Grade 1 IDC 1.6 cm with intermediate grade DCIS, margins negative, 0/8 lymph nodes negative, ER 90%, PR 100%, HER2 negative 1+, Ki-67 1%  Oncotype score 15: 4% ROR  Treatment plan: 1. Adjuvant radiation therapy 12/31/21-01/20/22 2. Adjuvant antiestrogen therapy with Tamoxifen 20 mg daily X 10 years  Tamoxifen Counseling: We discussed the risks and benefits of tamoxifen. These include but not limited to insomnia, hot flashes, mood changes, vaginal dryness, and weight gain. Although rare, serious side effects including endometrial cancer, risk of blood clots were also discussed. We strongly believe that the benefits far outweigh the risks. Patient understands these risks and consented to starting treatment. Planned treatment duration is 10 years.  RTC in 3 months for SCP visit

## 2022-01-19 ENCOUNTER — Ambulatory Visit
Admission: RE | Admit: 2022-01-19 | Discharge: 2022-01-19 | Disposition: A | Discharge status: Home / Self Care | Payer: No Typology Code available for payment source | Source: Ambulatory Visit | Attending: Radiation Oncology | Admitting: Radiation Oncology

## 2022-01-19 ENCOUNTER — Other Ambulatory Visit: Payer: Self-pay

## 2022-01-19 DIAGNOSIS — Z510 Encounter for antineoplastic radiation therapy: Secondary | ICD-10-CM | POA: Diagnosis not present

## 2022-01-19 LAB — RAD ONC ARIA SESSION SUMMARY
Course Elapsed Days: 20
Plan Fractions Treated to Date: 15
Plan Prescribed Dose Per Fraction: 2.66 Gy
Plan Total Fractions Prescribed: 16
Plan Total Prescribed Dose: 42.56 Gy
Reference Point Dosage Given to Date: 39.9 Gy
Reference Point Session Dosage Given: 2.66 Gy
Session Number: 15

## 2022-01-20 ENCOUNTER — Other Ambulatory Visit: Payer: Self-pay

## 2022-01-20 ENCOUNTER — Ambulatory Visit
Admission: RE | Admit: 2022-01-20 | Discharge: 2022-01-20 | Disposition: A | Discharge status: Home / Self Care | Payer: No Typology Code available for payment source | Source: Ambulatory Visit | Attending: Radiation Oncology | Admitting: Radiation Oncology

## 2022-01-20 ENCOUNTER — Ambulatory Visit: Payer: No Typology Code available for payment source

## 2022-01-20 ENCOUNTER — Encounter: Payer: Self-pay | Admitting: Radiation Oncology

## 2022-01-20 DIAGNOSIS — Z510 Encounter for antineoplastic radiation therapy: Secondary | ICD-10-CM | POA: Diagnosis not present

## 2022-01-20 LAB — RAD ONC ARIA SESSION SUMMARY
Course Elapsed Days: 21
Plan Fractions Treated to Date: 16
Plan Prescribed Dose Per Fraction: 2.66 Gy
Plan Total Fractions Prescribed: 16
Plan Total Prescribed Dose: 42.56 Gy
Reference Point Dosage Given to Date: 42.56 Gy
Reference Point Session Dosage Given: 2.66 Gy
Session Number: 16

## 2022-01-21 ENCOUNTER — Ambulatory Visit: Payer: No Typology Code available for payment source

## 2022-02-07 ENCOUNTER — Ambulatory Visit: Payer: No Typology Code available for payment source | Attending: General Surgery

## 2022-02-07 VITALS — Wt 123.2 lb

## 2022-02-07 DIAGNOSIS — Z483 Aftercare following surgery for neoplasm: Secondary | ICD-10-CM | POA: Insufficient documentation

## 2022-02-07 NOTE — Therapy (Addendum)
  OUTPATIENT PHYSICAL THERAPY SOZO SCREENING NOTE   Patient Name: Kathryn Mckenzie MRN: [id number] DOB:[birth year], 41 y.o., female Today's Date: 02/07/2022  PCP: Holland Commons, FNP REFERRING PROVIDER: Holland Commons, FNP   PT End of Session - 02/07/22 [phone]     Visit Number 6   # unchanged due to screen only   PT Start Time 0955    PT Stop Time 1000    PT Time Calculation (min) 5 min    Activity Tolerance Patient tolerated treatment well    Behavior During Therapy WFL for tasks assessed/performed             Past Medical History:  Diagnosis Date   Anxiety    Breast cancer ([place])    PONV (postoperative nausea and vomiting)    Past Surgical History:  Procedure Laterality Date   BREAST LUMPECTOMY WITH RADIOACTIVE SEED AND SENTINEL LYMPH NODE BIOPSY Right 11/10/2021   Procedure: RIGHT BREAST LUMPECTOMY WITH RADIOACTIVE SEED AND SENTINEL LYMPH NODE BIOPSY;  Surgeon: Jovita Kussmaul, MD;  Location: [facility];  Service: General;  Laterality: Right;   CESAREAN SECTION     TONSILLECTOMY     Patient Active Problem List   Diagnosis Date Noted   Genetic testing 09/20/2021   Family history of breast cancer 09/09/2021   Family history of pancreatic cancer 09/09/2021   Malignant neoplasm of upper-outer quadrant of right breast in female, estrogen receptor positive ([place]) 09/07/2021    REFERRING DIAG: right breast cancer at risk for lymphedema  THERAPY DIAG:  Aftercare following surgery for neoplasm  PERTINENT HISTORY: Patient was diagnosed on 08/30/2021 with right grade I invasive ductal carcinoma breast cancer. She underwent a right lumpectomy and sentinel node biopsy (8 negative nodes) on 11/10/2021. It is ER/PR positive and HER2 negative with a Ki67 of 1%  PRECAUTIONS: right UE Lymphedema risk, None  SUBJECTIVE: Pt returns for her 3 month L-Dex screen.   PAIN:  Are you having pain? No  SOZO SCREENING: Patient was assessed today using the SOZO machine to  determine the lymphedema index score. This was compared to her baseline score. It was determined that she is within the recommended range when compared to her baseline and no further action is needed at this time. She will continue SOZO screenings. These are done every 3 months for 2 years post operatively followed by every 6 months for 2 years, and then annually.   L-DEX FLOWSHEETS - 02/07/22 0900       L-DEX LYMPHEDEMA SCREENING   Measurement Type Unilateral    L-DEX MEASUREMENT EXTREMITY Upper Extremity    POSITION  Standing    DOMINANT SIDE Right    At Risk Side Right    BASELINE SCORE (UNILATERAL) -1.7    L-DEX SCORE (UNILATERAL) -0.5    VALUE CHANGE (UNILAT) 1.2              Otelia Limes, PTA 02/07/2022, 10:01 AM

## 2022-02-17 ENCOUNTER — Telehealth: Payer: Self-pay | Admitting: *Deleted

## 2022-02-17 NOTE — Telephone Encounter (Signed)
Returned patient's phone call, lvm for a return call 

## 2022-02-18 ENCOUNTER — Ambulatory Visit: Payer: No Typology Code available for payment source | Admitting: Radiation Oncology

## 2022-02-25 ENCOUNTER — Other Ambulatory Visit: Payer: Self-pay

## 2022-02-25 ENCOUNTER — Encounter: Payer: Self-pay | Admitting: Radiation Oncology

## 2022-02-25 ENCOUNTER — Ambulatory Visit
Admission: RE | Admit: 2022-02-25 | Discharge: 2022-02-25 | Disposition: A | Discharge status: Home / Self Care | Payer: No Typology Code available for payment source | Source: Ambulatory Visit | Attending: Radiation Oncology | Admitting: Radiation Oncology

## 2022-02-25 VITALS — BP 126/92 | HR 78 | Temp 97.3°F | Resp 18 | Ht 64.0 in | Wt 124.2 lb

## 2022-02-25 DIAGNOSIS — Z79899 Other long term (current) drug therapy: Secondary | ICD-10-CM | POA: Insufficient documentation

## 2022-02-25 DIAGNOSIS — Z923 Personal history of irradiation: Secondary | ICD-10-CM | POA: Diagnosis not present

## 2022-02-25 DIAGNOSIS — Z170 Estrogen receptor positive status [ER+]: Secondary | ICD-10-CM | POA: Diagnosis not present

## 2022-02-25 DIAGNOSIS — C50411 Malignant neoplasm of upper-outer quadrant of right female breast: Secondary | ICD-10-CM | POA: Diagnosis present

## 2022-02-25 NOTE — Progress Notes (Signed)
Kathryn Mckenzie presents today for follow-up after completing radiation to her right breast on 01/20/2022  Pain: Reports occasional twinges, but otherwise denies any concerns Skin: Reports skin is intact and almost back to her baseline color/texture ROM: Denies any issues or concerns Lymphedema: Denies MedOnc F/U: 04/20/2022 with Annabelle Harman in the [facility] Other issues of note: Saw her surgeon Dr. Marlou Starks on 02/04/2022.  Pt reports Yes No Comments  Tamoxifen '[x]'$  '[]'$  Having more difficulty sleeping at night, but states this isn't a new issue so she's not certain it's related to tamoxifen  Letrozole '[]'$  '[x]'$    Anastrazole '[]'$  '[x]'$    Mammogram '[x]'$  Date: TBD '[]'$ 

## 2022-02-25 NOTE — Progress Notes (Signed)
  Radiation Oncology         [phone] ________________________________  Name: Kathryn Mckenzie MRN: [id number]  Date: 02/25/2022  DOB: 1980/07/26  Follow-Up Visit Note  Outpatient  CC: Holland Commons, FNP  Holland Commons, FNP  Diagnosis and Prior Radiotherapy:    ICD-10-CM   1. Malignant neoplasm of upper-outer quadrant of right breast in female, estrogen receptor positive ([place])  C50.411    Z17.0       CHIEF COMPLAINT: Here for follow-up and surveillance of breast cancer  Narrative:  The patient returns today for routine follow-up.  Kathryn Mckenzie presents today for follow-up after completing radiation to her right breast on 01/20/2022  Pain: Reports occasional twinges, but otherwise denies any concerns Skin: Reports skin is intact and almost back to her baseline color/texture ROM: Denies any issues or concerns Lymphedema: Denies MedOnc F/U: 04/20/2022 with Annabelle Harman in the [facility] clinic Other issues of note: Saw her surgeon Dr. Marlou Starks on 02/04/2022.  Pt reports Yes No Comments  Tamoxifen '[x]'$  '[]'$  Having more difficulty sleeping at night, but states this isn't a new issue so she's not certain it's related to tamoxifen  Letrozole '[]'$  '[x]'$    Anastrazole '[]'$  '[x]'$    Mammogram '[x]'$  Date: TBD '[]'$                                  ALLERGIES:  has No Known Allergies.  Meds: Current Outpatient Medications  Medication Sig Dispense Refill   ALPRAZolam (XANAX) 0.5 MG tablet Take 1 tablet by mouth as needed.     tamoxifen (NOLVADEX) 20 MG tablet Take 1 tablet (20 mg total) by mouth daily. 90 tablet 3   venlafaxine XR (EFFEXOR-XR) 37.5 MG 24 hr capsule Take 1 capsule (37.5 mg total) by mouth daily with breakfast. 30 capsule 3   No current facility-administered medications for this encounter.    Physical Findings: The patient is in no acute distress. Patient is alert and oriented.  height is '5\' 4"'$  (1.626 m) and weight is 124 lb 3.2 oz (56.3 kg). Her temperature is 97.3  F (36.3 C) (abnormal). Her blood pressure is 126/92 (abnormal) and her pulse is 78. Her respiration is 18 and oxygen saturation is 100%. .    Satisfactory skin healing in radiotherapy fields.  It is actually difficult to see any sign of previous radiation.  She has healed really well.   Lab Findings: Lab Results  Component Value Date   WBC 5.9 09/08/2021   HGB 12.9 09/08/2021   HCT 38.3 09/08/2021   MCV 95.8 09/08/2021   PLT 239 09/08/2021    Radiographic Findings: No results found.  Impression/Plan: Healing well from radiotherapy to the breast tissue.  Continue skin care with topical Vitamin E Oil and / or lotion for at least 2 more months for further healing.  I encouraged her to continue with yearly mammography as appropriate (for intact breast tissue) and followup with medical oncology. I will see her back on an as-needed basis. I have encouraged her to call if she has any issues or concerns in the future. I wished her the very best.  On date of service, in total, I spent 15 minutes on this encounter. Patient was seen in person.  _____________________________________   Eppie Gibson, MD

## 2022-03-14 NOTE — Progress Notes (Signed)
                                                                                                                                                             Patient Name: CAI FLOTT MRN: [id number] DOB: 25-Oct-1980 Referring Physician: Thedora Hinders Date of Service: 01/20/2022 [facility], [place]                                                        End Of Treatment Note  Diagnoses: C50.411-Malignant neoplasm of upper-outer quadrant of right female breast  Cancer Staging:  Cancer Staging  Malignant neoplasm of upper-outer quadrant of right breast in female, estrogen receptor positive ([place]) Staging form: Breast, AJCC 8th Edition - Clinical stage from 09/08/2021: Stage IA (cT1c, cN0, cM0, G1, ER+, PR+, HER2-) - Signed by Nicholas Lose, MD on 09/08/2021 Stage prefix: Initial diagnosis Histologic grading system: 3 grade system    Intent: Curative  Radiation Treatment Dates: 12/30/2021 through 01/20/2022 Site Technique Total Dose (Gy) Dose per Fx (Gy) Completed Fx Beam Energies  Breast, Right: Breast_R 3D 42.56/42.56 2.66 16/16 6X   Narrative: The patient tolerated radiation therapy relatively well.   Plan: The patient will follow-up with radiation oncology in 72mo.  -----------------------------------  Eppie Gibson, MD

## 2022-04-14 ENCOUNTER — Telehealth: Payer: Self-pay

## 2022-04-14 NOTE — Telephone Encounter (Signed)
Attempted to call pt as we received new rx request from [facility] mail [facility] for a Rx Taoxifen 20 mg tab 1 tab PO QD.   Called pt and LVM for call back so she can let us know if this is an error or if she will be using this phx moving forward, as she was recently prescribed the above mentioned medication with a 90 day supply and 3 refills to [facility] in [place]. Asked pt to return call to [facility] to indicate if we should d/c Tamoxifen to [facility] and send Rx to [place],

## 2022-04-18 ENCOUNTER — Other Ambulatory Visit: Payer: Self-pay | Admitting: Hematology and Oncology

## 2022-04-20 ENCOUNTER — Other Ambulatory Visit: Payer: Self-pay

## 2022-04-20 ENCOUNTER — Encounter: Payer: Self-pay | Admitting: Adult Health

## 2022-04-20 ENCOUNTER — Inpatient Hospital Stay: Payer: No Typology Code available for payment source | Attending: Hematology and Oncology | Admitting: Adult Health

## 2022-04-20 VITALS — BP 122/78 | HR 73 | Temp 97.9°F | Resp 16 | Ht 64.0 in | Wt 129.0 lb

## 2022-04-20 DIAGNOSIS — C50411 Malignant neoplasm of upper-outer quadrant of right female breast: Secondary | ICD-10-CM | POA: Diagnosis not present

## 2022-04-20 DIAGNOSIS — N951 Menopausal and female climacteric states: Secondary | ICD-10-CM | POA: Diagnosis not present

## 2022-04-20 DIAGNOSIS — Z79899 Other long term (current) drug therapy: Secondary | ICD-10-CM | POA: Diagnosis not present

## 2022-04-20 DIAGNOSIS — Z170 Estrogen receptor positive status [ER+]: Secondary | ICD-10-CM | POA: Insufficient documentation

## 2022-04-20 DIAGNOSIS — Z923 Personal history of irradiation: Secondary | ICD-10-CM | POA: Insufficient documentation

## 2022-04-20 MED ORDER — VENLAFAXINE HCL ER 75 MG PO CP24: 75.0000 mg | ORAL_CAPSULE | Freq: Every day | ORAL | 3 refills | Status: DC

## 2022-04-20 NOTE — Progress Notes (Signed)
SURVIVORSHIP VISIT:   BRIEF ONCOLOGIC HISTORY:  Oncology History  Malignant neoplasm of upper-outer quadrant of right breast in female, estrogen receptor positive ([place])  09/02/2021 Initial Diagnosis   Palpable right breast lump for 2 to 3 months UOQ.  Mammogram and ultrasound: 10:00: 1.2 cm mass, axilla negative, biopsy: Grade 1 IDC with DCIS (inside a fibroadenoma) ER 90%, PR 100%, HER2 negative, Ki-67 1%   09/08/2021 Cancer Staging   Staging form: Breast, AJCC 8th Edition - Clinical stage from 09/08/2021: Stage IA (cT1c, cN0, cM0, G1, ER+, PR+, HER2-) - Signed by Nicholas Lose, MD on 09/08/2021 Stage prefix: Initial diagnosis Histologic grading system: 3 grade system    Genetic Testing   [facility] CancerNext-Expanded is Negative. Report date is 09/17/2021.  The CancerNext-Expanded gene panel offered by [facility] and includes sequencing, rearrangement, and RNA analysis for the following 77 genes: AIP, ALK, APC, ATM, AXIN2, BAP1, BARD1, BLM, BMPR1A, BRCA1, BRCA2, BRIP1, CDC73, CDH1, CDK4, CDKN1B, CDKN2A, CHEK2, CTNNA1, DICER1, FANCC, FH, FLCN, GALNT12, KIF1B, LZTR1, MAX, MEN1, MET, MLH1, MSH2, MSH3, MSH6, MUTYH, NBN, NF1, NF2, NTHL1, PALB2, PHOX2B, PMS2, POT1, PRKAR1A, PTCH1, PTEN, RAD51C, RAD51D, RB1, RECQL, RET, SDHA, SDHAF2, SDHB, SDHC, SDHD, SMAD4, SMARCA4, SMARCB1, SMARCE1, STK11, SUFU, TMEM127, TP53, TSC1, TSC2, VHL and XRCC2 (sequencing and deletion/duplication); EGFR, EGLN1, HOXB13, KIT, MITF, PDGFRA, POLD1, and POLE (sequencing only); EPCAM and GREM1 (deletion/duplication only).    11/10/2021 Surgery   Right lumpectomy: Grade 1 IDC 1.6 cm with intermediate grade DCIS, margins negative, 0/8 lymph nodes negative, ER 90%, PR 100%, HER2 negative 1+, Ki-67 1%   11/26/2021 Oncotype testing   Oncotype score 15 (ROR 4 %)   12/31/2021 - 01/20/2022 Radiation Therapy   Site Technique Total Dose (Gy) Dose per Fx (Gy) Completed Fx Beam Energies  Breast, Right: Breast_R 3D 42.56/42.56 2.66 16/16 6X      01/18/2022 -  Anti-estrogen oral therapy   Tamoxifen x 10 years     INTERVAL HISTORY:  Kathryn Mckenzie to review her survivorship care plan detailing her treatment course for breast cancer, as well as monitoring long-term side effects of that treatment, education regarding health maintenance, screening, and overall wellness and health promotion.     Overall, Kathryn Mckenzie reports feeling moderately well.  She is taking tamoxifen daily.  She notes that when she started the tamoxifen she was also changed from Zoloft to Effexor.  She has been taking Effexor at 37.5 mg daily.  Her biggest issue has been hot flashes and night sweats.  Her sleep is starting to improve slightly however she does still wake up a couple times a night.  She is doing exercise 3-4 times a week with pure bar and eats a very healthy diet and has gained about 7 pounds since he started tamoxifen.  Otherwise she is doing well and has no clinical concerns.  REVIEW OF SYSTEMS:  Review of Systems  Constitutional:  Positive for unexpected weight change. Negative for appetite change, chills, fatigue and fever.  HENT:   Negative for hearing loss, lump/mass and trouble swallowing.   Eyes:  Negative for eye problems and icterus.  Respiratory:  Negative for chest tightness, cough and shortness of breath.   Cardiovascular:  Negative for chest pain, leg swelling and palpitations.  Gastrointestinal:  Negative for abdominal distention, abdominal pain, constipation, diarrhea, nausea and vomiting.  Endocrine: Positive for hot flashes.  Genitourinary:  Negative for difficulty urinating.   Musculoskeletal:  Negative for arthralgias.  Skin:  Negative for itching and rash.  Neurological:  Negative for dizziness, extremity weakness, headaches and numbness.  Hematological:  Negative for adenopathy. Does not bruise/bleed easily.  Psychiatric/Behavioral:  Positive for sleep disturbance. Negative for depression. The patient is not nervous/anxious.    Breast:  Denies any new nodularity, masses, tenderness, nipple changes, or nipple discharge.      ONCOLOGY TREATMENT TEAM:  1. Surgeon:  Dr. Marlou Starks at [facility] 2. Medical Oncologist: Dr. Lindi Adie  3. Radiation Oncologist: Dr. Isidore Moos    PAST MEDICAL/SURGICAL HISTORY:  Past Medical History:  Diagnosis Date   Anxiety    Breast cancer ([place])    PONV (postoperative nausea and vomiting)    Past Surgical History:  Procedure Laterality Date   BREAST LUMPECTOMY WITH RADIOACTIVE SEED AND SENTINEL LYMPH NODE BIOPSY Right 11/10/2021   Procedure: RIGHT BREAST LUMPECTOMY WITH RADIOACTIVE SEED AND SENTINEL LYMPH NODE BIOPSY;  Surgeon: Jovita Kussmaul, MD;  Location: [facility];  Service: General;  Laterality: Right;   CESAREAN SECTION     TONSILLECTOMY       ALLERGIES:  No Known Allergies   CURRENT MEDICATIONS:  Outpatient Encounter Medications as of 04/20/2022  Medication Sig   ALPRAZolam (XANAX) 0.5 MG tablet Take 1 tablet by mouth as needed.   tamoxifen (NOLVADEX) 20 MG tablet Take 1 tablet (20 mg total) by mouth daily.   venlafaxine XR (EFFEXOR-XR) 37.5 MG 24 hr capsule TAKE 1 CAPSULE BY MOUTH DAILY WITH BREAKFAST   [DISCONTINUED] venlafaxine XR (EFFEXOR-XR) 37.5 MG 24 hr capsule Take 1 capsule (37.5 mg total) by mouth daily with breakfast.   No facility-administered encounter medications on file as of 04/20/2022.     ONCOLOGIC FAMILY HISTORY:  Family History  Problem Relation Age of Onset   Melanoma Maternal Aunt    Melanoma Maternal Uncle    Breast cancer Maternal Grandmother 75   Pancreatic cancer Paternal Grandmother 78      SOCIAL HISTORY:  Social History   Socioeconomic History   Marital status: Married    Spouse name: Not on file   Number of children: Not on file   Years of education: Not on file   Highest education level: Not on file  Occupational History   Not on file  Tobacco Use   Smoking status: Never   Smokeless tobacco: Never  Vaping  Use   Vaping Use: Never used  Substance and Sexual Activity   Alcohol use: Yes   Drug use: Never   Sexual activity: Not on file  Other Topics Concern   Not on file  Social History Narrative   Not on file   Social Determinants of Health   Financial Resource Strain: Not on file  Food Insecurity: Not on file  Transportation Needs: Not on file  Physical Activity: Not on file  Stress: Not on file  Social Connections: Not on file  Intimate Partner Violence: Not on file     OBSERVATIONS/OBJECTIVE:  BP 122/78 (BP Location: Left Arm, Patient Position: Sitting)   Pulse 73   Temp 97.9 F (36.6 C) (Temporal)   Resp 16   Ht 5' 4"  (1.626 m)   Wt 129 lb (58.5 kg)   SpO2 100%   BMI 22.14 kg/m  GENERAL: Patient is a well appearing female in no acute distress HEENT:  Sclerae anicteric.  Oropharynx clear and moist. No ulcerations or evidence of oropharyngeal candidiasis. Neck is supple.  NODES:  No cervical, supraclavicular, or axillary lymphadenopathy palpated.  BREAST EXAM:  Right breast status postlumpectomy and radiation there is  a seroma present no sign of local recurrence left breast is benign. LUNGS:  Clear to auscultation bilaterally.  No wheezes or rhonchi. HEART:  Regular rate and rhythm. No murmur appreciated. ABDOMEN:  Soft, nontender.  Positive, normoactive bowel sounds. No organomegaly palpated. MSK:  No focal spinal tenderness to palpation. Full range of motion bilaterally in the upper extremities. EXTREMITIES:  No peripheral edema.   SKIN:  Clear with no obvious rashes or skin changes. No nail dyscrasia. NEURO:  Nonfocal. Well oriented.  Appropriate affect.   LABORATORY DATA:  None for this visit.  DIAGNOSTIC IMAGING:  None for this visit.      ASSESSMENT AND PLAN:  Ms.. Mckenzie is a pleasant 41 y.o. female with Stage 1A right breast invasive ductal carcinoma, ER+/PR+/HER2-, diagnosed in February 2023, treated with lumpectomy, adjuvant radiation therapy, and  anti-estrogen therapy with tamoxifen beginning in June 2023.  She presents to the Survivorship Clinic for our initial meeting and routine follow-up post-completion of treatment for breast cancer.    1. Stage 1A right breast cancer:  Kathryn Mckenzie is continuing to recover from definitive treatment for breast cancer. She will follow-up with her medical oncologist, Dr. Lindi Adie in 6 months with history and physical exam per surveillance protocol.  She will continue her anti-estrogen therapy with mock 7. Thus far, she is tolerating the tamoxifen in well, with minimal side effects. She was instructed to make Dr. Lindi Adie or myself aware if she begins to experience any worsening side effects of the medication and I could see her back in clinic to help manage those side effects, as needed. Her mammogram is due February 2024; orders placed today.  We will add on an ultrasound due to the seroma it that is in her right breast.  Today, a comprehensive survivorship care plan and treatment summary was reviewed with the patient today detailing her breast cancer diagnosis, treatment course, potential late/long-term effects of treatment, appropriate follow-up care with recommendations for the future, and patient education resources.  A copy of this summary, along with a letter will be sent to the patient's primary care provider via mail/fax/In Basket message after today's visit.    2.  Hot flashes: She will increase her Effexor to 75 mg/day.  I sent a new prescription in for this to her pharmacy.  Hopefully that will help ameliorate some of her symptoms.  Also discussed the option of changing the time of day that she takes tamoxifen which can help with side effects.  3. Bone health:   She was given education on specific activities to promote bone health.  4. Cancer screening:  Due to Kathryn Mckenzie's history and her age, she should receive screening for skin cancers, colon cancer, and gynecologic cancers.  The information and  recommendations are listed on the patient's comprehensive care plan/treatment summary and were reviewed in detail with the patient.    5. Health maintenance and wellness promotion: Kathryn Mckenzie was encouraged to consume 5-7 servings of fruits and vegetables per day. We reviewed the "Nutrition Rainbow" handout.  She was also encouraged to engage in moderate to vigorous exercise for 30 minutes per day most days of the week. We discussed the [employer] fitness program, which is designed for cancer survivors to help them become more physically fit after cancer treatments.  She was instructed to limit her alcohol consumption and continue to abstain from tobacco use.     6. Support services/counseling: It is not uncommon for this period of the patient's cancer care  trajectory to be one of many emotions and stressors.    She was given information regarding our available services and encouraged to contact me with any questions or for help enrolling in any of our support group/programs.    Follow up instructions:    -Return to cancer center 6 months -Mammogram and breast ultrasounddue in February 2023 -Follow up with surgery 1 year -She is welcome to return back to the Survivorship Clinic at any time; no additional follow-up needed at this time.  -Consider referral back to survivorship as a long-term survivor for continued surveillance  The patient was provided an opportunity to ask questions and all were answered. The patient agreed with the plan and demonstrated an understanding of the instructions.   Total encounter time:40 minutes*in face-to-face visit time, chart review, lab review, care coordination, order entry, and documentation of the encounter time.    Wilber Bihari, NP 04/20/22 10:17 AM Medical Oncology and Hematology [facility] [place], [place] [address] Tel. [phone]    Fax. [phone]  *Total Encounter Time as defined by the Centers for Medicare  and Medicaid Services includes, in addition to the face-to-face time of a patient visit (documented in the note above) non-face-to-face time: obtaining and reviewing outside history, ordering and reviewing medications, tests or procedures, care coordination (communications with other health care professionals or caregivers) and documentation in the medical record.

## 2022-04-22 ENCOUNTER — Telehealth: Payer: Self-pay | Admitting: Hematology and Oncology

## 2022-04-22 NOTE — Telephone Encounter (Signed)
Spoke with patient confirming 4/9 appointment

## 2022-06-06 ENCOUNTER — Ambulatory Visit: Payer: No Typology Code available for payment source | Attending: General Surgery

## 2022-06-06 VITALS — Wt 125.2 lb

## 2022-06-06 DIAGNOSIS — Z170 Estrogen receptor positive status [ER+]: Secondary | ICD-10-CM | POA: Insufficient documentation

## 2022-06-06 DIAGNOSIS — Z483 Aftercare following surgery for neoplasm: Secondary | ICD-10-CM | POA: Insufficient documentation

## 2022-06-06 DIAGNOSIS — M25611 Stiffness of right shoulder, not elsewhere classified: Secondary | ICD-10-CM | POA: Insufficient documentation

## 2022-06-06 DIAGNOSIS — C50411 Malignant neoplasm of upper-outer quadrant of right female breast: Secondary | ICD-10-CM | POA: Insufficient documentation

## 2022-06-06 NOTE — Therapy (Addendum)
  OUTPATIENT PHYSICAL THERAPY SOZO SCREENING NOTE   Patient Name: Kathryn Mckenzie MRN: [id number] DOB:1981-03-22, 41 y.o., female Today's Date: 02/07/2022  PCP: Holland Commons, FNP REFERRING PROVIDER: Holland Commons, FNP   PT End of Session - 02/07/22 [id number]     Visit Number 6   # unchanged due to screen only   PT Start Time 0955    PT Stop Time 1000    PT Time Calculation (min) 5 min    Activity Tolerance Patient tolerated treatment well    Behavior During Therapy WFL for tasks assessed/performed             Past Medical History:  Diagnosis Date   Anxiety    Breast cancer ([place])    PONV (postoperative nausea and vomiting)    Past Surgical History:  Procedure Laterality Date   BREAST LUMPECTOMY WITH RADIOACTIVE SEED AND SENTINEL LYMPH NODE BIOPSY Right 11/10/2021   Procedure: RIGHT BREAST LUMPECTOMY WITH RADIOACTIVE SEED AND SENTINEL LYMPH NODE BIOPSY;  Surgeon: Jovita Kussmaul, MD;  Location: [facility];  Service: General;  Laterality: Right;   CESAREAN SECTION     TONSILLECTOMY     Patient Active Problem List   Diagnosis Date Noted   Genetic testing 09/20/2021   Family history of breast cancer 09/09/2021   Family history of pancreatic cancer 09/09/2021   Malignant neoplasm of upper-outer quadrant of right breast in female, estrogen receptor positive ([place]) 09/07/2021    REFERRING DIAG: right breast cancer at risk for lymphedema  THERAPY DIAG:  Aftercare following surgery for neoplasm  PERTINENT HISTORY: Patient was diagnosed on 08/30/2021 with right grade I invasive ductal carcinoma breast cancer. She underwent a right lumpectomy and sentinel node biopsy (8 negative nodes) on 11/10/2021. It is ER/PR positive and HER2 negative with a Ki67 of 1%  PRECAUTIONS: right UE Lymphedema risk, None  SUBJECTIVE: Pt returns for her 3 month L-Dex screen.  "I've noticed my tightness in my axilla is feeling a bit tighter again. I think I'd like to come back for some  physical therapy a few times for you to help me work it out."   PAIN:  Are you having pain? No  SOZO SCREENING: Patient was assessed today using the SOZO machine to determine the lymphedema index score. This was compared to her baseline score. It was determined that she is within the recommended range when compared to her baseline and no further action is needed at this time. She will continue SOZO screenings. These are done every 3 months for 2 years post operatively followed by every 6 months for 2 years, and then annually.  Patient reported a change in status to PTA which initiated the PTA consulting with a PT. PT determined it would be appropriate to initiate therapy at this time. PT requested a referral from patient's provider.    L-DEX FLOWSHEETS - 02/07/22 0900       L-DEX LYMPHEDEMA SCREENING   Measurement Type Unilateral    L-DEX MEASUREMENT EXTREMITY Upper Extremity    POSITION  Standing    DOMINANT SIDE Right    At Risk Side Right    BASELINE SCORE (UNILATERAL) -1.7    L-DEX SCORE (UNILATERAL) -0.5    VALUE CHANGE (UNILAT) 1.2              Otelia Limes, PTA 02/07/2022, 10:01 AM

## 2022-06-07 ENCOUNTER — Other Ambulatory Visit: Payer: Self-pay

## 2022-06-07 DIAGNOSIS — C50411 Malignant neoplasm of upper-outer quadrant of right female breast: Secondary | ICD-10-CM

## 2022-06-07 NOTE — Progress Notes (Signed)
PT referral entered per NP to evaluate post radiation tightness/fibrosis.

## 2022-06-08 NOTE — Therapy (Signed)
OUTPATIENT PHYSICAL THERAPY ONCOLOGY EVALUATION  Patient Name: Kathryn Mckenzie MRN: [id number] DOB:Aug 12, 1980, 41 y.o., female Today's Date: 06/09/2022   PT End of Session - 06/09/22 0807     Visit Number 1    Number of Visits 6    Date for PT Re-Evaluation 07/21/22    PT Start Time 0808   pt late   PT Stop Time 0852    PT Time Calculation (min) 44 min    Activity Tolerance Patient tolerated treatment well    Behavior During Therapy [facility] for tasks assessed/performed             Past Medical History:  Diagnosis Date   Anxiety    Breast cancer ([place])    PONV (postoperative nausea and vomiting)    Past Surgical History:  Procedure Laterality Date   BREAST LUMPECTOMY WITH RADIOACTIVE SEED AND SENTINEL LYMPH NODE BIOPSY Right 11/10/2021   Procedure: RIGHT BREAST LUMPECTOMY WITH RADIOACTIVE SEED AND SENTINEL LYMPH NODE BIOPSY;  Surgeon: Jovita Kussmaul, MD;  Location: [place];  Service: General;  Laterality: Right;   [place]     Patient Active Problem List   Diagnosis Date Noted   Genetic testing 09/20/2021   Family history of breast cancer 09/09/2021   Family history of pancreatic cancer 09/09/2021   Malignant neoplasm of upper-outer quadrant of right breast in female, estrogen receptor positive ([place]) 09/07/2021    PCP: Holland Commons FNP  REFERRING PROVIDER: Wilber Bihari, NP  REFERRING DIAG: Post radiation tightness/fibrosis  THERAPY DIAG:  Malignant neoplasm of upper-outer quadrant of right breast in female, estrogen receptor positive ([place])  Aftercare following surgery for neoplasm  Stiffness of right shoulder, not elsewhere classified  ONSET DATE: 12/2021  Rationale for Evaluation and Treatment: Rehabilitation  SUBJECTIVE:                                                                                                                                                                                            SUBJECTIVE STATEMENT: Radiation ended 01/20/2022. In the am when s e wakes up she is tight and painful from underarm and all the way under her breast.  She has resumed Pure Barre. When she does ER stretch and LTR stretch with goal post arms she feels really tight.. I feel like the seroma may still be there. It is hard around the incision area still. I still have tingling at times under the arm and in the posterior arm. I don't have pain, but more awareness.  I do get a little shooting pain in my breast that is infrequent.  Most tightness is in the anterior chest and lateral trunk  PERTINENT HISTORY:  Patient was diagnosed on 08/30/2021 with right grade I invasive ductal carcinoma breast cancer. She underwent a right lumpectomy and sentinel node biopsy (8 negative nodes) on 11/10/2021. It is ER/PR positive and HER2 negative with a Ki67 of 1% . She had 4 weeks of radiation which ended on 01/20/2022 PAIN:  Are you having pain? No tightness, discomfort PRECAUTIONS: Right UE lymphedema risk  WEIGHT BEARING RESTRICTIONS: No  FALLS:  Has patient fallen in last 6 months? No  LIVING ENVIRONMENT: Lives with: husband, 24 and 43 year old children Lives in: House/apartment Stairs: Yes; Internal: 12-15 steps; none and External: 5 steps; can reach both Has following equipment at home: none  OCCUPATION: interior design  LEISURE: Pure Barre?  HAND DOMINANCE: right   PRIOR LEVEL OF FUNCTION: Independent  PATIENT GOALS: Decrease chest tightness/discomfort, check firmness around scar   OBJECTIVE:  COGNITION: Overall cognitive status: Within functional limits for tasks assessed   PALPATION: Tender pec minor, sternal and clavicular pec major, lateral trunk, slight scar thickening under incision, tighter at lateral incision  OBSERVATIONS / OTHER ASSESSMENTS: No visible swelling, mild hyperpigmentation from radiation  SENSATION: Light touch: Deficits    POSTURE: forward head, round  shoulders  UPPER EXTREMITY AROM/PROM:  A/PROM RIGHT   eval   Shoulder extension 48  Shoulder flexion 156  Shoulder abduction 159  Shoulder internal rotation 84  Shoulder external rotation 89    (Blank rows = not tested)  A/PROM LEFT   eval  Shoulder extension 60  Shoulder flexion 159  Shoulder abduction 180  Shoulder internal rotation 78  Shoulder external rotation 98    (Blank rows = not tested)  CERVICAL AROM: All within normal limits:     UPPER EXTREMITY STRENGTH: WFL  LYMPHEDEMA ASSESSMENTS:   SURGERY TYPE/DATE: Right lumpectomy with SLNB  NUMBER OF LYMPH NODES REMOVED: 8  CHEMOTHERAPY: no  RADIATION:yes  HORMONE TREATMENT: YES  INFECTIONS: NO  LYMPHEDEMA ASSESSMENTS:   LANDMARK RIGHT  eval  10 cm proximal to olecranon process 27.0  Olecranon process 22.7  10 cm proximal to ulnar styloid process 20.1  Just proximal to ulnar styloid process 14.8  Across hand at thumb web space 18.7  At base of 2nd digit 6.2  (Blank rows = not tested)  LANDMARK LEFT  eval  10 cm proximal to olecranon process 27.3  Olecranon process 22.5  10 cm proximal to ulnar styloid process 20.5  Just proximal to ulnar styloid process 14.1  Across hand at thumb web space 18.9  At base of 2nd digit 6.35  (Blank rows = not tested)  QUICK DASH SURVEY: 11.36   TODAY'S TREATMENT:  DATE: 06/09/2022 Reviewed lower trunk rotation with extended arms and goal post arms and discussed not overstretching but gradually trying to go further, single arm pec doorway stretch, SB lateral trunk stretch. Avoid heavy wt Barre class right now until ROM improved. Stretch 2-3 times per day 3 repetitions, 15-30 sec  PATIENT EDUCATION:  Education details: scar massage, foam pad in compression bra to decrease slightly firm area, stretch 2-3 times per day Person  educated: Patient Education method: Explanation Education comprehension: verbalized understanding and returned demonstration  HOME EXERCISE PROGRAM: Single arm chest stretch, lower trunk rotation with arms extended and goal post, SB stretch to the left, wall slide for abduction  ASSESSMENT:  CLINICAL IMPRESSION: Patient is a 41 y.o. female who was seen today for physical therapy evaluation and treatment for post radiation tightness and fibrosis after her right lumpectomy with SLNB.. She presents with mild right shoulder AROM limitations, mild fibrosis/scar tissue at incision, and concerns of tightness especially in pectorals and lateral trunk. We reviewed stretches to perform and she was advised to perform 2-3 times per day to improve her ROM and decrease tightness. She will stop doing the Heavy Pure Barre Class and will stick with light wts for now until ROM/tightness is improved. She will initiate scar massage to the incision area 2x/s per day.   OBJECTIVE IMPAIRMENTS: decreased knowledge of condition, decreased ROM, impaired flexibility, impaired sensation, and impaired UE functional use.   ACTIVITY LIMITATIONS:  able to do all but with increased tightness  PARTICIPATION LIMITATIONS:  none but with increased tightness  PERSONAL FACTORS: Time since onset of injury/illness/exacerbation and 1-2 comorbidities: right breast cancer s/p radiation  are also affecting patient's functional outcome.   REHAB POTENTIAL: Excellent  CLINICAL DECISION MAKING: Stable/uncomplicated  EVALUATION COMPLEXITY: Low  GOALS: Goals reviewed with patient? Yes LONG TERM GOALS: Target date: 07/21/2022    Pt will be compliant with stretching HEP on a regular basis 2-3 times/day Baseline:  Goal status: INITIAL  2.  Pt will have full AROM right shoulder with 0-min complaints of tightness Baseline:  Goal status: INITIAL  3.  Pt will have decreased tenderness at incision after regular performance of scar  massage Baseline:  Goal status: INITIAL    PLAN:  PT FREQUENCY: 1x/week  PT DURATION: 6 weeks  PLANNED INTERVENTIONS: Therapeutic exercises, Patient/Family education, Self Care, Orthotic/Fit training, scar mobilization, and Manual therapy  PLAN FOR NEXT SESSION: pt scheduled x 1 visit in 2 weeks, may call to cancel if doing well. Consider STM to right UQ esp. Pectorals, lats, standing lat stretch, Scapular strengthening with band,PROM prn, does she have compression sleeve?   Claris Pong, PT 06/09/2022, 8:54 AM

## 2022-06-09 ENCOUNTER — Ambulatory Visit: Payer: No Typology Code available for payment source

## 2022-06-09 ENCOUNTER — Other Ambulatory Visit: Payer: Self-pay

## 2022-06-09 DIAGNOSIS — M25611 Stiffness of right shoulder, not elsewhere classified: Secondary | ICD-10-CM | POA: Diagnosis present

## 2022-06-09 DIAGNOSIS — Z170 Estrogen receptor positive status [ER+]: Secondary | ICD-10-CM | POA: Diagnosis present

## 2022-06-09 DIAGNOSIS — Z483 Aftercare following surgery for neoplasm: Secondary | ICD-10-CM

## 2022-06-09 DIAGNOSIS — C50411 Malignant neoplasm of upper-outer quadrant of right female breast: Secondary | ICD-10-CM | POA: Diagnosis present

## 2022-06-24 ENCOUNTER — Ambulatory Visit: Payer: No Typology Code available for payment source

## 2022-06-29 ENCOUNTER — Ambulatory Visit: Payer: No Typology Code available for payment source | Attending: General Surgery

## 2022-06-29 DIAGNOSIS — Z483 Aftercare following surgery for neoplasm: Secondary | ICD-10-CM | POA: Diagnosis present

## 2022-06-29 DIAGNOSIS — C50411 Malignant neoplasm of upper-outer quadrant of right female breast: Secondary | ICD-10-CM | POA: Insufficient documentation

## 2022-06-29 DIAGNOSIS — R293 Abnormal posture: Secondary | ICD-10-CM | POA: Diagnosis present

## 2022-06-29 DIAGNOSIS — Z170 Estrogen receptor positive status [ER+]: Secondary | ICD-10-CM | POA: Diagnosis present

## 2022-06-29 DIAGNOSIS — M25611 Stiffness of right shoulder, not elsewhere classified: Secondary | ICD-10-CM | POA: Insufficient documentation

## 2022-06-29 DIAGNOSIS — M79601 Pain in right arm: Secondary | ICD-10-CM | POA: Diagnosis present

## 2022-06-29 NOTE — Therapy (Signed)
OUTPATIENT PHYSICAL THERAPY ONCOLOGY EVALUATION  Patient Name: Kathryn Mckenzie MRN: [id number] DOB:03-Jun-1981, 41 y.o., female Today's Date: 06/29/2022   PT End of Session - 06/29/22 0808     Visit Number 2    Number of Visits 18    Date for PT Re-Evaluation 08/24/22    PT Start Time 0809    PT Stop Time 9629    PT Time Calculation (min) 48 min    Activity Tolerance Patient tolerated treatment well    Behavior During Therapy WFL for tasks assessed/performed             Past Medical History:  Diagnosis Date   Anxiety    Breast cancer ([place])    PONV (postoperative nausea and vomiting)    Past Surgical History:  Procedure Laterality Date   BREAST LUMPECTOMY WITH RADIOACTIVE SEED AND SENTINEL LYMPH NODE BIOPSY Right 11/10/2021   Procedure: RIGHT BREAST LUMPECTOMY WITH RADIOACTIVE SEED AND SENTINEL LYMPH NODE BIOPSY;  Surgeon: Jovita Kussmaul, MD;  Location: [place];  Service: General;  Laterality: Right;   [place]     Patient Active Problem List   Diagnosis Date Noted   Genetic testing 09/20/2021   Family history of breast cancer 09/09/2021   Family history of pancreatic cancer 09/09/2021   Malignant neoplasm of upper-outer quadrant of right breast in female, estrogen receptor positive ([place]) 09/07/2021    PCP: Holland Commons FNP  REFERRING PROVIDER: Wilber Bihari, NP  REFERRING DIAG: Post radiation tightness/fibrosis  THERAPY DIAG:  Malignant neoplasm of upper-outer quadrant of right breast in female, estrogen receptor positive ([place]) - Plan: PT plan of care cert/re-cert  Aftercare following surgery for neoplasm - Plan: PT plan of care cert/re-cert  Stiffness of right shoulder, not elsewhere classified - Plan: PT plan of care cert/re-cert  Abnormal posture - Plan: PT plan of care cert/re-cert  ONSET DATE: 11/2839  Rationale for Evaluation and Treatment: Rehabilitation  SUBJECTIVE:                                                                                                                                                                                            SUBJECTIVE STATEMENT:  I am feeling about the same. I have been doing the stretches. I had one day with pain shooting down my arm, but it hasn't happened since then. I have been massaging the incision area but it still feels like a bruise. I do have a compression sleeve, but I really haven't worn it.. I am not doing the heavy wt. Class but I am still doing the 3lb class and  planks.   PERTINENT HISTORY:  Patient was diagnosed on 08/30/2021 with right grade I invasive ductal carcinoma breast cancer. She underwent a right lumpectomy and sentinel node biopsy (8 negative nodes) on 11/10/2021. It is ER/PR positive and HER2 negative with a Ki67 of 1% . She had 4 weeks of radiation which ended on 01/20/2022 PAIN:  Are you having pain? No tightness, discomfort PRECAUTIONS: Right UE lymphedema risk  WEIGHT BEARING RESTRICTIONS: No  FALLS:  Has patient fallen in last 6 months? No  LIVING ENVIRONMENT: Lives with: husband, 74 and 68 year old children Lives in: House/apartment Stairs: Yes; Internal: 12-15 steps; none and External: 5 steps; can reach both Has following equipment at home: none  OCCUPATION: interior design  LEISURE: Pure Barre?  HAND DOMINANCE: right   PRIOR LEVEL OF FUNCTION: Independent  PATIENT GOALS: Decrease chest tightness/discomfort, check firmness around scar   OBJECTIVE:  COGNITION: Overall cognitive status: Within functional limits for tasks assessed   PALPATION: Tender pec minor, sternal and clavicular pec major, lateral trunk, slight scar thickening under incision, tighter at lateral incision  OBSERVATIONS / OTHER ASSESSMENTS: No visible swelling, mild hyperpigmentation from radiation  SENSATION: Light touch: Deficits    POSTURE: forward head, round shoulders  UPPER EXTREMITY AROM/PROM:  A/PROM RIGHT   eval    Shoulder extension 48  Shoulder flexion 156  Shoulder abduction 159  Shoulder internal rotation 84  Shoulder external rotation 89    (Blank rows = not tested)  A/PROM LEFT   eval  Shoulder extension 60  Shoulder flexion 159  Shoulder abduction 180  Shoulder internal rotation 78  Shoulder external rotation 98    (Blank rows = not tested)  CERVICAL AROM: All within normal limits:     UPPER EXTREMITY STRENGTH: WFL  LYMPHEDEMA ASSESSMENTS:   SURGERY TYPE/DATE: Right lumpectomy with SLNB  NUMBER OF LYMPH NODES REMOVED: 8  CHEMOTHERAPY: no  RADIATION:yes  HORMONE TREATMENT: YES  INFECTIONS: NO  LYMPHEDEMA ASSESSMENTS:   LANDMARK RIGHT  eval  10 cm proximal to olecranon process 27.0  Olecranon process 22.7  10 cm proximal to ulnar styloid process 20.1  Just proximal to ulnar styloid process 14.8  Across hand at thumb web space 18.7  At base of 2nd digit 6.2  (Blank rows = not tested)  LANDMARK LEFT  eval  10 cm proximal to olecranon process 27.3  Olecranon process 22.5  10 cm proximal to ulnar styloid process 20.5  Just proximal to ulnar styloid process 14.1  Across hand at thumb web space 18.9  At base of 2nd digit 6.35  (Blank rows = not tested)  QUICK DASH SURVEY: 11.36   TODAY'S TREATMENT:   06/29/2022 Pt reports she has a compression sleeve, but doesn't wear much Soft tissue  mobilization in supine to right UT, pectorals, serratus, lats and in SL to UT, scapular area, serratus and lats with cocoa butter Supine wand scaption with hands in 2 positions x 3-5 ea PROM right shoulder flexion, scaption, abduction with MFR at axillary area AROM bilateral shoulder flexion, scaption horizontal abd x 5 on table and over towel roll in thoracic spine x 2. Pt will try using her foam roll at home Discussed activities and pt will cut back on the number of planks, and stretch well before and after.She will do a yoga stretching class. Discussed DN and pt would like  to try. Recert done to add DN and extend dates  DATE: 06/09/2022 Reviewed lower trunk rotation with extended arms and goal post arms and discussed not overstretching but gradually trying to go further, single arm pec doorway stretch, SB lateral trunk stretch. Avoid heavy wt Barre class right now until ROM improved. Stretch 2-3 times per day 3 repetitions, 15-30 sec  PATIENT EDUCATION:  Education details: scar massage, foam pad in compression bra to decrease slightly firm area, stretch 2-3 times per day Person educated: Patient Education method: Explanation Education comprehension: verbalized understanding and returned demonstration  HOME EXERCISE PROGRAM: Single arm chest stretch, lower trunk rotation with arms extended and goal post, SB stretch to the left, wall slide for abduction  ASSESSMENT:  CLINICAL IMPRESSION: Pt with significant tightness in right axillary border and clavicular aspect of pectorals despite pt stretching on a regular basis. Initiated Soft tissue mobilization today  and will add and schedule DN to address tight/tender areas.Pt  felt tingling in arm after doing foam roll stretch at the end of session .  OBJECTIVE IMPAIRMENTS: decreased knowledge of condition, decreased ROM, impaired flexibility, impaired sensation, and impaired UE functional use.   ACTIVITY LIMITATIONS:  able to do all but with increased tightness  PARTICIPATION LIMITATIONS:  none but with increased tightness  PERSONAL FACTORS: Time since onset of injury/illness/exacerbation and 1-2 comorbidities: right breast cancer s/p radiation  are also affecting patient's functional outcome.   REHAB POTENTIAL: Excellent  CLINICAL DECISION MAKING: Stable/uncomplicated  EVALUATION COMPLEXITY: Low  GOALS: Goals reviewed with patient? Yes LONG TERM GOALS: Target date:  08/24/2022   Pt will be compliant with stretching HEP on a regular basis 2-3 times/day Baseline:  Goal status: INITIAL  2.  Pt will have full AROM right shoulder with 0-min complaints of tightness Baseline:  Goal status: INITIAL  3.  Pt will have decreased tenderness at incision after regular performance of scar massage Baseline:  Goal status: INITIAL    PLAN:  PT FREQUENCY: 1-2x  PT DURATION: 8 weeks PLANNED INTERVENTIONS: Therapeutic exercises, Patient/Family education, Self Care, Orthotic/Fit training, scar mobilization, and Manual therapy  PLAN FOR NEXT SESSION:  Continue STM to right UQ esp. Pectorals, lats, standing lat stretch,Dry needling,Scapular strengthening with band,PROM prn,   Claris Pong, PT 06/29/2022, 12:24 PM

## 2022-07-04 ENCOUNTER — Encounter: Payer: Self-pay | Admitting: Physical Therapy

## 2022-07-04 ENCOUNTER — Ambulatory Visit: Payer: No Typology Code available for payment source | Admitting: Physical Therapy

## 2022-07-04 DIAGNOSIS — Z483 Aftercare following surgery for neoplasm: Secondary | ICD-10-CM

## 2022-07-04 DIAGNOSIS — C50411 Malignant neoplasm of upper-outer quadrant of right female breast: Secondary | ICD-10-CM | POA: Diagnosis not present

## 2022-07-04 DIAGNOSIS — M25611 Stiffness of right shoulder, not elsewhere classified: Secondary | ICD-10-CM

## 2022-07-04 DIAGNOSIS — R293 Abnormal posture: Secondary | ICD-10-CM

## 2022-07-04 DIAGNOSIS — Z170 Estrogen receptor positive status [ER+]: Secondary | ICD-10-CM

## 2022-07-04 DIAGNOSIS — M79601 Pain in right arm: Secondary | ICD-10-CM

## 2022-07-04 NOTE — Therapy (Signed)
OUTPATIENT PHYSICAL THERAPY ONCOLOGY TREATMENT  Patient Name: Kathryn Mckenzie MRN: [id number] DOB:December 29, 1980, 42 y.o., female Today's Date: 07/04/2022   PT End of Session - 07/04/22 0851     Visit Number 3    Number of Visits 18    Date for PT Re-Evaluation 08/24/22    PT Start Time 0808    PT Stop Time 0850    PT Time Calculation (min) 42 min    Activity Tolerance Patient tolerated treatment well    Behavior During Therapy WFL for tasks assessed/performed              Past Medical History:  Diagnosis Date   Anxiety    Breast cancer ([place])    PONV (postoperative nausea and vomiting)    Past Surgical History:  Procedure Laterality Date   BREAST LUMPECTOMY WITH RADIOACTIVE SEED AND SENTINEL LYMPH NODE BIOPSY Right 11/10/2021   Procedure: RIGHT BREAST LUMPECTOMY WITH RADIOACTIVE SEED AND SENTINEL LYMPH NODE BIOPSY;  Surgeon: Jovita Kussmaul, MD;  Location: [place];  Service: General;  Laterality: Right;   [place]     Patient Active Problem List   Diagnosis Date Noted   Genetic testing 09/20/2021   Family history of breast cancer 09/09/2021   Family history of pancreatic cancer 09/09/2021   Malignant neoplasm of upper-outer quadrant of right breast in female, estrogen receptor positive ([place]) 09/07/2021    PCP: Holland Commons FNP  REFERRING PROVIDER: Wilber Bihari, NP  REFERRING DIAG: Post radiation tightness/fibrosis  THERAPY DIAG:  Stiffness of right shoulder, not elsewhere classified  Pain in right arm  Abnormal posture  Aftercare following surgery for neoplasm  Malignant neoplasm of upper-outer quadrant of right breast in female, estrogen receptor positive ([place])  ONSET DATE: 12/2021  Rationale for Evaluation and Treatment: Rehabilitation  SUBJECTIVE:                                                                                                                                                                                            SUBJECTIVE STATEMENT:  I have not been to the gym since last session. I have been doing my exercises. I have not been able to tell a huge improvement. I think the massage helped for a few days.   PERTINENT HISTORY:  Patient was diagnosed on 08/30/2021 with right grade I invasive ductal carcinoma breast cancer. She underwent a right lumpectomy and sentinel node biopsy (8 negative nodes) on 11/10/2021. It is ER/PR positive and HER2 negative with a Ki67 of 1% . She had 4 weeks of radiation which ended on 01/20/2022 PAIN:  Are you having pain? No tightness, discomfort PRECAUTIONS: Right UE lymphedema risk  WEIGHT BEARING RESTRICTIONS: No  FALLS:  Has patient fallen in last 6 months? No  LIVING ENVIRONMENT: Lives with: husband, 31 and 10 year old children Lives in: House/apartment Stairs: Yes; Internal: 12-15 steps; none and External: 5 steps; can reach both Has following equipment at home: none  OCCUPATION: interior design  LEISURE: Pure Barre?  HAND DOMINANCE: right   PRIOR LEVEL OF FUNCTION: Independent  PATIENT GOALS: Decrease chest tightness/discomfort, check firmness around scar   OBJECTIVE:  COGNITION: Overall cognitive status: Within functional limits for tasks assessed   PALPATION: Tender pec minor, sternal and clavicular pec major, lateral trunk, slight scar thickening under incision, tighter at lateral incision  OBSERVATIONS / OTHER ASSESSMENTS: No visible swelling, mild hyperpigmentation from radiation  SENSATION: Light touch: Deficits    POSTURE: forward head, round shoulders  UPPER EXTREMITY AROM/PROM:  A/PROM RIGHT   eval   Shoulder extension 48  Shoulder flexion 156  Shoulder abduction 159  Shoulder internal rotation 84  Shoulder external rotation 89    (Blank rows = not tested)  A/PROM LEFT   eval  Shoulder extension 60  Shoulder flexion 159  Shoulder abduction 180  Shoulder internal rotation 78  Shoulder external  rotation 98    (Blank rows = not tested)  CERVICAL AROM: All within normal limits:     UPPER EXTREMITY STRENGTH: WFL  LYMPHEDEMA ASSESSMENTS:   SURGERY TYPE/DATE: Right lumpectomy with SLNB  NUMBER OF LYMPH NODES REMOVED: 8  CHEMOTHERAPY: no  RADIATION:yes  HORMONE TREATMENT: YES  INFECTIONS: NO  LYMPHEDEMA ASSESSMENTS:   LANDMARK RIGHT  eval  10 cm proximal to olecranon process 27.0  Olecranon process 22.7  10 cm proximal to ulnar styloid process 20.1  Just proximal to ulnar styloid process 14.8  Across hand at thumb web space 18.7  At base of 2nd digit 6.2  (Blank rows = not tested)  LANDMARK LEFT  eval  10 cm proximal to olecranon process 27.3  Olecranon process 22.5  10 cm proximal to ulnar styloid process 20.5  Just proximal to ulnar styloid process 14.1  Across hand at thumb web space 18.9  At base of 2nd digit 6.35  (Blank rows = not tested)  QUICK DASH SURVEY: 11.36   TODAY'S TREATMENT:   07/04/22 Instructed pt in supine snow angels on large mat table and educated pt to do 10 reps at home and hold the stretch where she begins to feel it while still being able to keep her arm on the mat. Instructed her in supine over foam roll with arms outstretched for pec stretch and hold for 5 min and then supine over foam roll alternating flexion while trying to touch thumb to mat with pt reporting increased tightness in axilla extending down just inferior to breast.  Soft tissue mobilization in supine to R pecs, serratus and lats with cocoa butter spending increased time at scar area as well to decrease fibrosis while moving arm in various ranges of PROM Brief MFR to cord that was in R axilla and found near end of session   06/29/2022 Pt reports she has a compression sleeve, but doesn't wear much Soft tissue  mobilization in supine to right UT, pectorals, serratus, lats and in SL to UT, scapular area, serratus and lats with cocoa butter Supine wand scaption with  hands in 2 positions x 3-5 ea PROM right shoulder flexion, scaption, abduction with MFR at axillary area AROM bilateral  shoulder flexion, scaption horizontal abd x 5 on table and over towel roll in thoracic spine x 2. Pt will try using her foam roll at home Discussed activities and pt will cut back on the number of planks, and stretch well before and after.She will do a yoga stretching class. Discussed DN and pt would like to try. Recert done to add DN and extend dates                                                                                                                                         DATE: 06/09/2022 Reviewed lower trunk rotation with extended arms and goal post arms and discussed not overstretching but gradually trying to go further, single arm pec doorway stretch, SB lateral trunk stretch. Avoid heavy wt Barre class right now until ROM improved. Stretch 2-3 times per day 3 repetitions, 15-30 sec  PATIENT EDUCATION:  Education details: scar massage, foam pad in compression bra to decrease slightly firm area, stretch 2-3 times per day, snow angels in supine, supine over foam roll with arms outstretched, supine over foam roll alternating flexion Person educated: Patient Education method: Explanation Education comprehension: verbalized understanding and returned demonstration  HOME EXERCISE PROGRAM: Single arm chest stretch, lower trunk rotation with arms extended and goal post, SB stretch to the left, wall slide for abduction  ASSESSMENT:  CLINICAL IMPRESSION: Therapist was able to find a cord in pt's R axilla today and educated pt that is probably the cause of her tightness since it initially improves with therapy but then tightens back up. Instructed pt in new pec stretches today to add to her HEP. Continued with manual therapy to decrease pec tightness.   OBJECTIVE IMPAIRMENTS: decreased knowledge of condition, decreased ROM, impaired flexibility, impaired sensation, and  impaired UE functional use.   ACTIVITY LIMITATIONS:  able to do all but with increased tightness  PARTICIPATION LIMITATIONS:  none but with increased tightness  PERSONAL FACTORS: Time since onset of injury/illness/exacerbation and 1-2 comorbidities: right breast cancer s/p radiation  are also affecting patient's functional outcome.   REHAB POTENTIAL: Excellent  CLINICAL DECISION MAKING: Stable/uncomplicated  EVALUATION COMPLEXITY: Low  GOALS: Goals reviewed with patient? Yes LONG TERM GOALS: Target date: 08/24/2022   Pt will be compliant with stretching HEP on a regular basis 2-3 times/day Baseline:  Goal status: INITIAL  2.  Pt will have full AROM right shoulder with 0-min complaints of tightness Baseline:  Goal status: INITIAL  3.  Pt will have decreased tenderness at incision after regular performance of scar massage Baseline:  Goal status: INITIAL    PLAN:  PT FREQUENCY: 1-2x  PT DURATION: 8 weeks PLANNED INTERVENTIONS: Therapeutic exercises, Patient/Family education, Self Care, Orthotic/Fit training, scar mobilization, and Manual therapy  PLAN FOR NEXT SESSION:  Continue STM to right UQ esp. Pectorals, lats, standing lat stretch,Dry needling,Scapular strengthening with band,PROM prn,   [employer],  PT 07/04/2022, 8:54 AM

## 2022-07-07 ENCOUNTER — Ambulatory Visit: Payer: No Typology Code available for payment source | Admitting: Rehabilitative and Restorative Service Providers"

## 2022-07-11 ENCOUNTER — Encounter: Payer: Self-pay | Admitting: Physical Therapy

## 2022-07-11 ENCOUNTER — Ambulatory Visit: Payer: No Typology Code available for payment source | Admitting: Physical Therapy

## 2022-07-11 DIAGNOSIS — Z483 Aftercare following surgery for neoplasm: Secondary | ICD-10-CM

## 2022-07-11 DIAGNOSIS — M79601 Pain in right arm: Secondary | ICD-10-CM

## 2022-07-11 DIAGNOSIS — R293 Abnormal posture: Secondary | ICD-10-CM

## 2022-07-11 DIAGNOSIS — C50411 Malignant neoplasm of upper-outer quadrant of right female breast: Secondary | ICD-10-CM | POA: Diagnosis not present

## 2022-07-11 DIAGNOSIS — Z170 Estrogen receptor positive status [ER+]: Secondary | ICD-10-CM

## 2022-07-11 DIAGNOSIS — M25611 Stiffness of right shoulder, not elsewhere classified: Secondary | ICD-10-CM

## 2022-07-11 NOTE — Therapy (Signed)
OUTPATIENT PHYSICAL THERAPY ONCOLOGY TREATMENT  Patient Name: Kathryn Mckenzie MRN: [id number] DOB:[birth year], 41 y.o., female Today's Date: 07/11/2022   PT End of Session - 07/11/22 1605     Visit Number 4    Number of Visits 18    Date for PT Re-Evaluation 08/24/22    PT Start Time 1604    PT Stop Time 1779    PT Time Calculation (min) 48 min    Activity Tolerance Patient tolerated treatment well    Behavior During Therapy WFL for tasks assessed/performed              Past Medical History:  Diagnosis Date   Anxiety    Breast cancer ([place])    PONV (postoperative nausea and vomiting)    Past Surgical History:  Procedure Laterality Date   BREAST LUMPECTOMY WITH RADIOACTIVE SEED AND SENTINEL LYMPH NODE BIOPSY Right 11/10/2021   Procedure: RIGHT BREAST LUMPECTOMY WITH RADIOACTIVE SEED AND SENTINEL LYMPH NODE BIOPSY;  Surgeon: Jovita Kussmaul, MD;  Location: [place];  Service: General;  Laterality: Right;   [place]     Patient Active Problem List   Diagnosis Date Noted   Genetic testing 09/20/2021   Family history of breast cancer 09/09/2021   Family history of pancreatic cancer 09/09/2021   Malignant neoplasm of upper-outer quadrant of right breast in female, estrogen receptor positive ([place]) 09/07/2021    PCP: Holland Commons FNP  REFERRING PROVIDER: Wilber Bihari, NP  REFERRING DIAG: Post radiation tightness/fibrosis  THERAPY DIAG:  Stiffness of right shoulder, not elsewhere classified  Pain in right arm  Abnormal posture  Aftercare following surgery for neoplasm  Malignant neoplasm of upper-outer quadrant of right breast in female, estrogen receptor positive ([place])  ONSET DATE: 12/2021  Rationale for Evaluation and Treatment: Rehabilitation  SUBJECTIVE:                                                                                                                                                                                            SUBJECTIVE STATEMENT: I have been thrown off my game because I was so sick and unable to get out of bed for two days. It doesn't hurt but I have not done anything.   PERTINENT HISTORY:  Patient was diagnosed on 08/30/2021 with right grade I invasive ductal carcinoma breast cancer. She underwent a right lumpectomy and sentinel node biopsy (8 negative nodes) on 11/10/2021. It is ER/PR positive and HER2 negative with a Ki67 of 1% . She had 4 weeks of radiation which ended on 01/20/2022 PAIN:  Are you having pain? No  tightness, discomfort with stretching  PRECAUTIONS: Right UE lymphedema risk  WEIGHT BEARING RESTRICTIONS: No  FALLS:  Has patient fallen in last 6 months? No  LIVING ENVIRONMENT: Lives with: husband, 40 and 2 year old children Lives in: House/apartment Stairs: Yes; Internal: 12-15 steps; none and External: 5 steps; can reach both Has following equipment at home: none  OCCUPATION: interior design  LEISURE: Pure Barre?  HAND DOMINANCE: right   PRIOR LEVEL OF FUNCTION: Independent  PATIENT GOALS: Decrease chest tightness/discomfort, check firmness around scar   OBJECTIVE:  COGNITION: Overall cognitive status: Within functional limits for tasks assessed   PALPATION: Tender pec minor, sternal and clavicular pec major, lateral trunk, slight scar thickening under incision, tighter at lateral incision  OBSERVATIONS / OTHER ASSESSMENTS: No visible swelling, mild hyperpigmentation from radiation  SENSATION: Light touch: Deficits    POSTURE: forward head, round shoulders  UPPER EXTREMITY AROM/PROM:  A/PROM RIGHT   eval   Shoulder extension 48  Shoulder flexion 156  Shoulder abduction 159  Shoulder internal rotation 84  Shoulder external rotation 89    (Blank rows = not tested)  A/PROM LEFT   eval  Shoulder extension 60  Shoulder flexion 159  Shoulder abduction 180  Shoulder internal rotation 78  Shoulder external rotation 98     (Blank rows = not tested)  CERVICAL AROM: All within normal limits:     UPPER EXTREMITY STRENGTH: WFL  LYMPHEDEMA ASSESSMENTS:   SURGERY TYPE/DATE: Right lumpectomy with SLNB  NUMBER OF LYMPH NODES REMOVED: 8  CHEMOTHERAPY: no  RADIATION:yes  HORMONE TREATMENT: YES  INFECTIONS: NO  LYMPHEDEMA ASSESSMENTS:   LANDMARK RIGHT  eval  10 cm proximal to olecranon process 27.0  Olecranon process 22.7  10 cm proximal to ulnar styloid process 20.1  Just proximal to ulnar styloid process 14.8  Across hand at thumb web space 18.7  At base of 2nd digit 6.2  (Blank rows = not tested)  LANDMARK LEFT  eval  10 cm proximal to olecranon process 27.3  Olecranon process 22.5  10 cm proximal to ulnar styloid process 20.5  Just proximal to ulnar styloid process 14.1  Across hand at thumb web space 18.9  At base of 2nd digit 6.35  (Blank rows = not tested)  QUICK DASH SURVEY: 11.36   TODAY'S TREATMENT:   07/11/22 Therapeutic exercise: supine snow angels on large mat table x 10 reps at home, supine over foam roll with arms outstretched for pec stretch and hold for 4 min and then supine over foam roll alternating flexion while trying to touch thumb to mat with pt reporting increased tightness in axilla extending down just inferior to breast.  MFR to cording from R axilla extending down upper arm and down towards scar  07/04/22 Instructed pt in supine snow angels on large mat table and educated pt to do 10 reps at home and hold the stretch where she begins to feel it while still being able to keep her arm on the mat. Instructed her in supine over foam roll with arms outstretched for pec stretch and hold for 5 min and then supine over foam roll alternating flexion while trying to touch thumb to mat with pt reporting increased tightness in axilla extending down just inferior to breast.  Soft tissue mobilization in supine to R pecs, serratus and lats with cocoa butter spending increased  time at scar area as well to decrease fibrosis while moving arm in various ranges of PROM Brief MFR to cord that was  in R axilla and found near end of session   06/29/2022 Pt reports she has a compression sleeve, but doesn't wear much Soft tissue  mobilization in supine to right UT, pectorals, serratus, lats and in SL to UT, scapular area, serratus and lats with cocoa butter Supine wand scaption with hands in 2 positions x 3-5 ea PROM right shoulder flexion, scaption, abduction with MFR at axillary area AROM bilateral shoulder flexion, scaption horizontal abd x 5 on table and over towel roll in thoracic spine x 2. Pt will try using her foam roll at home Discussed activities and pt will cut back on the number of planks, and stretch well before and after.She will do a yoga stretching class. Discussed DN and pt would like to try. Recert done to add DN and extend dates                                                                                                                                         DATE: 06/09/2022 Reviewed lower trunk rotation with extended arms and goal post arms and discussed not overstretching but gradually trying to go further, single arm pec doorway stretch, SB lateral trunk stretch. Avoid heavy wt Barre class right now until ROM improved. Stretch 2-3 times per day 3 repetitions, 15-30 sec  PATIENT EDUCATION:  Education details: scar massage, foam pad in compression bra to decrease slightly firm area, stretch 2-3 times per day, snow angels in supine, supine over foam roll with arms outstretched, supine over foam roll alternating flexion Person educated: Patient Education method: Explanation Education comprehension: verbalized understanding and returned demonstration  HOME EXERCISE PROGRAM: Single arm chest stretch, lower trunk rotation with arms extended and goal post, SB stretch to the left, wall slide for abduction  ASSESSMENT:  CLINICAL IMPRESSION: Pt missed her  appointments last week because she was sick. She was unable to exercise during this time. She still has cording present so focused this session on myofascial release to the cord which extends down upper arm and to her scar. She still has increased tightness across her pecs so reviewed snow angel exercise and supine over foam roll.   OBJECTIVE IMPAIRMENTS: decreased knowledge of condition, decreased ROM, impaired flexibility, impaired sensation, and impaired UE functional use.   ACTIVITY LIMITATIONS:  able to do all but with increased tightness  PARTICIPATION LIMITATIONS:  none but with increased tightness  PERSONAL FACTORS: Time since onset of injury/illness/exacerbation and 1-2 comorbidities: right breast cancer s/p radiation  are also affecting patient's functional outcome.   REHAB POTENTIAL: Excellent  CLINICAL DECISION MAKING: Stable/uncomplicated  EVALUATION COMPLEXITY: Low  GOALS: Goals reviewed with patient? Yes LONG TERM GOALS: Target date: 08/24/2022   Pt will be compliant with stretching HEP on a regular basis 2-3 times/day Baseline:  Goal status: INITIAL  2.  Pt will have full AROM right shoulder with 0-min complaints of tightness Baseline:  Goal  status: INITIAL  3.  Pt will have decreased tenderness at incision after regular performance of scar massage Baseline:  Goal status: INITIAL    PLAN:  PT FREQUENCY: 1-2x  PT DURATION: 8 weeks PLANNED INTERVENTIONS: Therapeutic exercises, Patient/Family education, Self Care, Orthotic/Fit training, scar mobilization, and Manual therapy  PLAN FOR NEXT SESSION:  Continue STM to right UQ esp. Pectorals, lats, standing lat stretch,Dry needling,Scapular strengthening with band,PROM prn,   [employer], PT 07/11/2022, 4:57 PM

## 2022-07-15 ENCOUNTER — Ambulatory Visit: Payer: No Typology Code available for payment source | Admitting: Rehabilitative and Restorative Service Providers"

## 2022-07-15 ENCOUNTER — Encounter: Payer: Self-pay | Admitting: Rehabilitative and Restorative Service Providers"

## 2022-07-15 DIAGNOSIS — M79601 Pain in right arm: Secondary | ICD-10-CM

## 2022-07-15 DIAGNOSIS — Z483 Aftercare following surgery for neoplasm: Secondary | ICD-10-CM

## 2022-07-15 DIAGNOSIS — Z170 Estrogen receptor positive status [ER+]: Secondary | ICD-10-CM

## 2022-07-15 DIAGNOSIS — C50411 Malignant neoplasm of upper-outer quadrant of right female breast: Secondary | ICD-10-CM | POA: Diagnosis not present

## 2022-07-15 DIAGNOSIS — M25611 Stiffness of right shoulder, not elsewhere classified: Secondary | ICD-10-CM

## 2022-07-15 NOTE — Patient Instructions (Signed)

## 2022-07-15 NOTE — Therapy (Signed)
OUTPATIENT PHYSICAL THERAPY ONCOLOGY TREATMENT  Patient Name: Kathryn Mckenzie MRN: [id number] DOB:[birth year], 41 y.o., female Today's Date: 07/15/2022   PT End of Session - 07/15/22 0856     Visit Number 5    Date for PT Re-Evaluation 08/24/22    PT Start Time 0851   Pt arrived late for appointment   PT Stop Time 0925    PT Time Calculation (min) 34 min    Activity Tolerance Patient tolerated treatment well    Behavior During Therapy [facility] for tasks assessed/performed              Past Medical History:  Diagnosis Date   Anxiety    Breast cancer ([place])    PONV (postoperative nausea and vomiting)    Past Surgical History:  Procedure Laterality Date   BREAST LUMPECTOMY WITH RADIOACTIVE SEED AND SENTINEL LYMPH NODE BIOPSY Right 11/10/2021   Procedure: RIGHT BREAST LUMPECTOMY WITH RADIOACTIVE SEED AND SENTINEL LYMPH NODE BIOPSY;  Surgeon: Jovita Kussmaul, MD;  Location: [place];  Service: General;  Laterality: Right;   [place]     Patient Active Problem List   Diagnosis Date Noted   Genetic testing 09/20/2021   Family history of breast cancer 09/09/2021   Family history of pancreatic cancer 09/09/2021   Malignant neoplasm of upper-outer quadrant of right breast in female, estrogen receptor positive ([place]) 09/07/2021    PCP: Holland Commons FNP  REFERRING PROVIDER: Wilber Bihari, NP  REFERRING DIAG: Post radiation tightness/fibrosis  THERAPY DIAG:  Stiffness of right shoulder, not elsewhere classified  Pain in right arm  Aftercare following surgery for neoplasm  Malignant neoplasm of upper-outer quadrant of right breast in female, estrogen receptor positive ([place])  ONSET DATE: 12/2021  Rationale for Evaluation and Treatment: Rehabilitation  SUBJECTIVE:                                                                                                                                                                                            SUBJECTIVE STATEMENT: Pt reports some increased soreness and tightness today.  PERTINENT HISTORY:  Patient was diagnosed on 08/30/2021 with right grade I invasive ductal carcinoma breast cancer. She underwent a right lumpectomy and sentinel node biopsy (8 negative nodes) on 11/10/2021. It is ER/PR positive and HER2 negative with a Ki67 of 1% . She had 4 weeks of radiation which ended on 01/20/2022 PAIN:  Are you having pain? Yes 4-5/10 with use.  Primarily tightness, discomfort with stretching  PRECAUTIONS: Right UE lymphedema risk  WEIGHT BEARING RESTRICTIONS: No  FALLS:  Has patient fallen in last  6 months? No  LIVING ENVIRONMENT: Lives with: husband, 70 and 59 year old children Lives in: House/apartment Stairs: Yes; Internal: 12-15 steps; none and External: 5 steps; can reach both Has following equipment at home: none  OCCUPATION: interior design  LEISURE: Pure Barre?  HAND DOMINANCE: right   PRIOR LEVEL OF FUNCTION: Independent  PATIENT GOALS: Decrease chest tightness/discomfort, check firmness around scar   OBJECTIVE:  COGNITION: Overall cognitive status: Within functional limits for tasks assessed   PALPATION: Tender pec minor, sternal and clavicular pec major, lateral trunk, slight scar thickening under incision, tighter at lateral incision  OBSERVATIONS / OTHER ASSESSMENTS: No visible swelling, mild hyperpigmentation from radiation  SENSATION: Light touch: Deficits    POSTURE: forward head, round shoulders  UPPER EXTREMITY AROM/PROM:  A/PROM RIGHT   eval   Shoulder extension 48  Shoulder flexion 156  Shoulder abduction 159  Shoulder internal rotation 84  Shoulder external rotation 89    (Blank rows = not tested)  A/PROM LEFT   eval  Shoulder extension 60  Shoulder flexion 159  Shoulder abduction 180  Shoulder internal rotation 78  Shoulder external rotation 98    (Blank rows = not tested)  CERVICAL AROM: All within normal limits:      UPPER EXTREMITY STRENGTH: WFL  LYMPHEDEMA ASSESSMENTS:   SURGERY TYPE/DATE: Right lumpectomy with SLNB  NUMBER OF LYMPH NODES REMOVED: 8  CHEMOTHERAPY: no  RADIATION:yes  HORMONE TREATMENT: YES  INFECTIONS: NO  LYMPHEDEMA ASSESSMENTS:   LANDMARK RIGHT  eval  10 cm proximal to olecranon process 27.0  Olecranon process 22.7  10 cm proximal to ulnar styloid process 20.1  Just proximal to ulnar styloid process 14.8  Across hand at thumb web space 18.7  At base of 2nd digit 6.2  (Blank rows = not tested)  LANDMARK LEFT  eval  10 cm proximal to olecranon process 27.3  Olecranon process 22.5  10 cm proximal to ulnar styloid process 20.5  Just proximal to ulnar styloid process 14.1  Across hand at thumb web space 18.9  At base of 2nd digit 6.35  (Blank rows = not tested)  QUICK DASH SURVEY: 11.36   TODAY'S TREATMENT:    07/15/2022: Trigger Point Dry-Needling  Treatment instructions: Expect mild to moderate muscle soreness. S/S of pneumothorax if dry needled over a lung field, and to seek immediate medical attention should they occur. Patient verbalized understanding of these instructions and education.  Patient Consent Given: Yes Education handout provided: Yes Muscles treated: right upper trap, right rhomboid, right pec, right lats, right delts Electrical stimulation performed: No Parameters: N/A Treatment response/outcome: Utilized skilled palpation to locate/identify trigger points.  Able to illicit twitch response and muscle elongation with dry needling. Followed with soft tissue mobilization to right upper quadrant and axilla to further encourage tissue elongation.    07/11/22 Therapeutic exercise: supine snow angels on large mat table x 10 reps at home, supine over foam roll with arms outstretched for pec stretch and hold for 4 min and then supine over foam roll alternating flexion while trying to touch thumb to mat with pt reporting increased tightness  in axilla extending down just inferior to breast.  MFR to cording from R axilla extending down upper arm and down towards scar  07/04/22 Instructed pt in supine snow angels on large mat table and educated pt to do 10 reps at home and hold the stretch where she begins to feel it while still being able to keep her arm on the mat.  Instructed her in supine over foam roll with arms outstretched for pec stretch and hold for 5 min and then supine over foam roll alternating flexion while trying to touch thumb to mat with pt reporting increased tightness in axilla extending down just inferior to breast.  Soft tissue mobilization in supine to R pecs, serratus and lats with cocoa butter spending increased time at scar area as well to decrease fibrosis while moving arm in various ranges of PROM Brief MFR to cord that was in R axilla and found near end of session     PATIENT EDUCATION:  Education details: scar massage, foam pad in compression bra to decrease slightly firm area, stretch 2-3 times per day, snow angels in supine, supine over foam roll with arms outstretched, supine over foam roll alternating flexion Person educated: Patient Education method: Explanation Education comprehension: verbalized understanding and returned demonstration  HOME EXERCISE PROGRAM: Single arm chest stretch, lower trunk rotation with arms extended and goal post, SB stretch to the left, wall slide for abduction  ASSESSMENT:  CLINICAL IMPRESSION: Kemari presents to skilled PT for dry needling as an adjunct to the therapy she has been previously receiving.  Patient educated in dry needling and she verbalizes her understanding and consent.  Patient with good twitch responses noted during dry needling and following, pt reported decreased tightness feeling with range of motion.  Reviewed HEP and stretching and pt reports compliance and understanding.  Patient to continue to require skilled PT to further progress with cording noted  following surgical procedure and treatment.   OBJECTIVE IMPAIRMENTS: decreased knowledge of condition, decreased ROM, impaired flexibility, impaired sensation, and impaired UE functional use.   ACTIVITY LIMITATIONS:  able to do all but with increased tightness  PARTICIPATION LIMITATIONS:  none but with increased tightness  PERSONAL FACTORS: Time since onset of injury/illness/exacerbation and 1-2 comorbidities: right breast cancer s/p radiation  are also affecting patient's functional outcome.   REHAB POTENTIAL: Excellent  CLINICAL DECISION MAKING: Stable/uncomplicated  EVALUATION COMPLEXITY: Low  GOALS: Goals reviewed with patient? Yes LONG TERM GOALS: Target date: 08/24/2022   Pt will be compliant with stretching HEP on a regular basis 2-3 times/day Baseline:  Goal status: IN PROGRESS  2.  Pt will have full AROM right shoulder with 0-min complaints of tightness Baseline:  Goal status: IN PROGRESS  3.  Pt will have decreased tenderness at incision after regular performance of scar massage Baseline:  Goal status: INITIAL    PLAN:  PT FREQUENCY: 1-2x  PT DURATION: 8 weeks PLANNED INTERVENTIONS: Therapeutic exercises, Patient/Family education, Self Care, Orthotic/Fit training, scar mobilization, and Manual therapy  PLAN FOR NEXT SESSION:  Assess response to dry needling, Continue STM to right UQ esp. Pectorals, lats, standing lat stretch,Dry needling,Scapular strengthening with band,PROM prn,   Juel Burrow, PT 07/15/2022, 9:29 AM  [facility] [address], [place], [place] [address] Phone # [phone] Fax [phone]

## 2022-07-19 ENCOUNTER — Ambulatory Visit: Payer: No Typology Code available for payment source

## 2022-07-19 DIAGNOSIS — C50411 Malignant neoplasm of upper-outer quadrant of right female breast: Secondary | ICD-10-CM | POA: Diagnosis not present

## 2022-07-19 DIAGNOSIS — Z483 Aftercare following surgery for neoplasm: Secondary | ICD-10-CM

## 2022-07-19 DIAGNOSIS — M79601 Pain in right arm: Secondary | ICD-10-CM

## 2022-07-19 DIAGNOSIS — Z170 Estrogen receptor positive status [ER+]: Secondary | ICD-10-CM

## 2022-07-19 DIAGNOSIS — M25611 Stiffness of right shoulder, not elsewhere classified: Secondary | ICD-10-CM

## 2022-07-19 DIAGNOSIS — R293 Abnormal posture: Secondary | ICD-10-CM

## 2022-07-19 NOTE — Therapy (Signed)
OUTPATIENT PHYSICAL THERAPY ONCOLOGY TREATMENT  Patient Name: Kathryn Mckenzie MRN: [id number] DOB:03-30-1981, 41 y.o., female Today's Date: 07/19/2022   PT End of Session - 07/19/22 1103     Visit Number 6    Number of Visits 18    Date for PT Re-Evaluation 08/24/22    PT Start Time 1104    PT Stop Time 1150    PT Time Calculation (min) 46 min    Activity Tolerance Patient tolerated treatment well    Behavior During Therapy WFL for tasks assessed/performed              Past Medical History:  Diagnosis Date   Anxiety    Breast cancer ([place])    PONV (postoperative nausea and vomiting)    Past Surgical History:  Procedure Laterality Date   BREAST LUMPECTOMY WITH RADIOACTIVE SEED AND SENTINEL LYMPH NODE BIOPSY Right 11/10/2021   Procedure: RIGHT BREAST LUMPECTOMY WITH RADIOACTIVE SEED AND SENTINEL LYMPH NODE BIOPSY;  Surgeon: Jovita Kussmaul, MD;  Location: [place];  Service: General;  Laterality: Right;   [place]     Patient Active Problem List   Diagnosis Date Noted   Genetic testing 09/20/2021   Family history of breast cancer 09/09/2021   Family history of pancreatic cancer 09/09/2021   Malignant neoplasm of upper-outer quadrant of right breast in female, estrogen receptor positive ([place]) 09/07/2021    PCP: Holland Commons FNP  REFERRING PROVIDER: Wilber Bihari, NP  REFERRING DIAG: Post radiation tightness/fibrosis  THERAPY DIAG:  Stiffness of right shoulder, not elsewhere classified  Pain in right arm  Aftercare following surgery for neoplasm  Malignant neoplasm of upper-outer quadrant of right breast in female, estrogen receptor positive ([place])  Abnormal posture  ONSET DATE: 12/2021  Rationale for Evaluation and Treatment: Rehabilitation  SUBJECTIVE:                                                                                                                                                                                            SUBJECTIVE STATEMENT:  I did feel some immediate release after the DN. I have had more odd shooting pain in the last few days; some in the breast and some in the shoulder. It does feel looser. I could feel the cording with stirring things  over the weekend and with lifting.   PERTINENT HISTORY:  Patient was diagnosed on 08/30/2021 with right grade I invasive ductal carcinoma breast cancer. She underwent a right lumpectomy and sentinel node biopsy (8 negative nodes) on 11/10/2021. It is ER/PR positive and HER2 negative with a Ki67 of  1% . She had 4 weeks of radiation which ended on 01/20/2022 PAIN:  Are you having pain? Yes 4/10 with use.  Primarily tightness, discomfort with stretching  PRECAUTIONS: Right UE lymphedema risk  WEIGHT BEARING RESTRICTIONS: No  FALLS:  Has patient fallen in last 6 months? No  LIVING ENVIRONMENT: Lives with: husband, 66 and 75 year old children Lives in: House/apartment Stairs: Yes; Internal: 12-15 steps; none and External: 5 steps; can reach both Has following equipment at home: none  OCCUPATION: interior design  LEISURE: Pure Barre?  HAND DOMINANCE: right   PRIOR LEVEL OF FUNCTION: Independent  PATIENT GOALS: Decrease chest tightness/discomfort, check firmness around scar   OBJECTIVE:  COGNITION: Overall cognitive status: Within functional limits for tasks assessed   PALPATION: Tender pec minor, sternal and clavicular pec major, lateral trunk, slight scar thickening under incision, tighter at lateral incision  OBSERVATIONS / OTHER ASSESSMENTS: No visible swelling, mild hyperpigmentation from radiation  SENSATION: Light touch: Deficits    POSTURE: forward head, round shoulders  UPPER EXTREMITY AROM/PROM:  A/PROM RIGHT   eval   Shoulder extension 48  Shoulder flexion 156  Shoulder abduction 159  Shoulder internal rotation 84  Shoulder external rotation 89    (Blank rows = not tested)  A/PROM LEFT   eval   Shoulder extension 60  Shoulder flexion 159  Shoulder abduction 180  Shoulder internal rotation 78  Shoulder external rotation 98    (Blank rows = not tested)  CERVICAL AROM: All within normal limits:     UPPER EXTREMITY STRENGTH: WFL  LYMPHEDEMA ASSESSMENTS:   SURGERY TYPE/DATE: Right lumpectomy with SLNB  NUMBER OF LYMPH NODES REMOVED: 8  CHEMOTHERAPY: no  RADIATION:yes  HORMONE TREATMENT: YES  INFECTIONS: NO  LYMPHEDEMA ASSESSMENTS:   LANDMARK RIGHT  eval  10 cm proximal to olecranon process 27.0  Olecranon process 22.7  10 cm proximal to ulnar styloid process 20.1  Just proximal to ulnar styloid process 14.8  Across hand at thumb web space 18.7  At base of 2nd digit 6.2  (Blank rows = not tested)  LANDMARK LEFT  eval  10 cm proximal to olecranon process 27.3  Olecranon process 22.5  10 cm proximal to ulnar styloid process 20.5  Just proximal to ulnar styloid process 14.1  Across hand at thumb web space 18.9  At base of 2nd digit 6.35  (Blank rows = not tested)  QUICK DASH SURVEY: 11.36   TODAY'S TREATMENT:    07/19/2022 MFR to right axilla and upper arm areas of cording with longitudinal and S technique Soft tissue mobilization to right pectorals, UT and lats PROM right shoulder with MFR at axilla   07/15/2022: Trigger Point Dry-Needling  Treatment instructions: Expect mild to moderate muscle soreness. S/S of pneumothorax if dry needled over a lung field, and to seek immediate medical attention should they occur. Patient verbalized understanding of these instructions and education.  Patient Consent Given: Yes Education handout provided: Yes Muscles treated: right upper trap, right rhomboid, right pec, right lats, right delts Electrical stimulation performed: No Parameters: N/A Treatment response/outcome: Utilized skilled palpation to locate/identify trigger points.  Able to illicit twitch response and muscle elongation with dry  needling. Followed with soft tissue mobilization to right upper quadrant and axilla to further encourage tissue elongation.    07/11/22 Therapeutic exercise: supine snow angels on large mat table x 10 reps at home, supine over foam roll with arms outstretched for pec stretch and hold for 4 min and then supine over  foam roll alternating flexion while trying to touch thumb to mat with pt reporting increased tightness in axilla extending down just inferior to breast.  MFR to cording from R axilla extending down upper arm and down towards scar  07/04/22 Instructed pt in supine snow angels on large mat table and educated pt to do 10 reps at home and hold the stretch where she begins to feel it while still being able to keep her arm on the mat. Instructed her in supine over foam roll with arms outstretched for pec stretch and hold for 5 min and then supine over foam roll alternating flexion while trying to touch thumb to mat with pt reporting increased tightness in axilla extending down just inferior to breast.  Soft tissue mobilization in supine to R pecs, serratus and lats with cocoa butter spending increased time at scar area as well to decrease fibrosis while moving arm in various ranges of PROM Brief MFR to cord that was in R axilla and found near end of session     PATIENT EDUCATION:  Education details: scar massage, foam pad in compression bra to decrease slightly firm area, stretch 2-3 times per day, snow angels in supine, supine over foam roll with arms outstretched, supine over foam roll alternating flexion Person educated: Patient Education method: Explanation Education comprehension: verbalized understanding and returned demonstration  HOME EXERCISE PROGRAM: Single arm chest stretch, lower trunk rotation with arms extended and goal post, SB stretch to the left, wall slide for abduction  ASSESSMENT:  CLINICAL IMPRESSION: Continued MFR techniques to cording, and soft tissue  mobilization to right UQ musculature. Pt with significant cording noted and also tightness noted in pectoral muscles. Advised pt to continue working on stretches, and to use her foam roll at home. OBJECTIVE IMPAIRMENTS: decreased knowledge of condition, decreased ROM, impaired flexibility, impaired sensation, and impaired UE functional use.   ACTIVITY LIMITATIONS:  able to do all but with increased tightness  PARTICIPATION LIMITATIONS:  none but with increased tightness  PERSONAL FACTORS: Time since onset of injury/illness/exacerbation and 1-2 comorbidities: right breast cancer s/p radiation  are also affecting patient's functional outcome.   REHAB POTENTIAL: Excellent  CLINICAL DECISION MAKING: Stable/uncomplicated  EVALUATION COMPLEXITY: Low  GOALS: Goals reviewed with patient? Yes LONG TERM GOALS: Target date: 08/24/2022   Pt will be compliant with stretching HEP on a regular basis 2-3 times/day Baseline:  Goal status: IN PROGRESS  2.  Pt will have full AROM right shoulder with 0-min complaints of tightness Baseline:  Goal status: IN PROGRESS  3.  Pt will have decreased tenderness at incision after regular performance of scar massage Baseline:  Goal status: INITIAL    PLAN:  PT FREQUENCY: 1-2x  PT DURATION: 8 weeks PLANNED INTERVENTIONS: Therapeutic exercises, Patient/Family education, Self Care, Orthotic/Fit training, scar mobilization, and Manual therapy  PLAN FOR NEXT SESSION:  Assess response to dry needling, Continue STM to right UQ esp. Pectorals, lats, standing lat stretch,Dry needling,Scapular strengthening with band,PROM prn,   Claris Pong, PT 07/19/2022, 12:58 PM  [facility] [address] [place], [place] [address] Phone # [phone] Fax [phone]

## 2022-07-20 ENCOUNTER — Encounter: Payer: Self-pay | Admitting: Rehabilitative and Restorative Service Providers"

## 2022-07-20 ENCOUNTER — Ambulatory Visit: Payer: No Typology Code available for payment source | Admitting: Rehabilitative and Restorative Service Providers"

## 2022-07-20 DIAGNOSIS — M25611 Stiffness of right shoulder, not elsewhere classified: Secondary | ICD-10-CM

## 2022-07-20 DIAGNOSIS — C50411 Malignant neoplasm of upper-outer quadrant of right female breast: Secondary | ICD-10-CM | POA: Diagnosis not present

## 2022-07-20 DIAGNOSIS — M79601 Pain in right arm: Secondary | ICD-10-CM

## 2022-07-20 DIAGNOSIS — Z483 Aftercare following surgery for neoplasm: Secondary | ICD-10-CM

## 2022-07-20 DIAGNOSIS — Z170 Estrogen receptor positive status [ER+]: Secondary | ICD-10-CM

## 2022-07-20 NOTE — Therapy (Signed)
OUTPATIENT PHYSICAL THERAPY ONCOLOGY TREATMENT  Patient Name: Kathryn Mckenzie MRN: [id number] DOB:1980-10-14, 41 y.o., female Today's Date: 07/20/2022   PT End of Session - 07/20/22 1536     Visit Number 7    Date for PT Re-Evaluation 08/24/22    PT Start Time 1529    PT Stop Time 9381    PT Time Calculation (min) 38 min    Activity Tolerance Patient tolerated treatment well    Behavior During Therapy WFL for tasks assessed/performed              Past Medical History:  Diagnosis Date   Anxiety    Breast cancer ([place])    PONV (postoperative nausea and vomiting)    Past Surgical History:  Procedure Laterality Date   BREAST LUMPECTOMY WITH RADIOACTIVE SEED AND SENTINEL LYMPH NODE BIOPSY Right 11/10/2021   Procedure: RIGHT BREAST LUMPECTOMY WITH RADIOACTIVE SEED AND SENTINEL LYMPH NODE BIOPSY;  Surgeon: Jovita Kussmaul, MD;  Location: [facility];  Service: General;  Laterality: Right;   Kathryn Mckenzie     Patient Active Problem List   Diagnosis Date Noted   Genetic testing 09/20/2021   Family history of breast cancer 09/09/2021   Family history of pancreatic cancer 09/09/2021   Malignant neoplasm of upper-outer quadrant of right breast in female, estrogen receptor positive ([place]) 09/07/2021    PCP: Holland Commons FNP  REFERRING PROVIDER: Wilber Bihari, NP  REFERRING DIAG: Post radiation tightness/fibrosis  THERAPY DIAG:  Stiffness of right shoulder, not elsewhere classified  Pain in right arm  Aftercare following surgery for neoplasm  Malignant neoplasm of upper-outer quadrant of right breast in female, estrogen receptor positive ([place])  ONSET DATE: 12/2021  Rationale for Evaluation and Treatment: Rehabilitation  SUBJECTIVE:                                                                                                                                                                                           SUBJECTIVE  STATEMENT: Pt reports feeling better with dry needling, but is still having some nerve pain.   PERTINENT HISTORY:  Patient was diagnosed on 08/30/2021 with right grade I invasive ductal carcinoma breast cancer. She underwent a right lumpectomy and sentinel node biopsy (8 negative nodes) on 11/10/2021. It is ER/PR positive and HER2 negative with a Ki67 of 1% . She had 4 weeks of radiation which ended on 01/20/2022 PAIN:  Are you having pain? Yes 3/10 with use.  Primarily tightness, discomfort with stretching  PRECAUTIONS: Right UE lymphedema risk  WEIGHT BEARING RESTRICTIONS: No  FALLS:  Has patient fallen in  last 6 months? No  LIVING ENVIRONMENT: Lives with: husband, 9 and 26 year old children Lives in: House/apartment Stairs: Yes; Internal: 12-15 steps; none and External: 5 steps; can reach both Has following equipment at home: none  OCCUPATION: interior design  LEISURE: Pure Barre?  HAND DOMINANCE: right   PRIOR LEVEL OF FUNCTION: Independent  PATIENT GOALS: Decrease chest tightness/discomfort, check firmness around scar   OBJECTIVE:  COGNITION: Overall cognitive status: Within functional limits for tasks assessed   PALPATION: Tender pec minor, sternal and clavicular pec major, lateral trunk, slight scar thickening under incision, tighter at lateral incision  OBSERVATIONS / OTHER ASSESSMENTS: No visible swelling, mild hyperpigmentation from radiation  SENSATION: Light touch: Deficits    POSTURE: forward head, round shoulders  UPPER EXTREMITY AROM/PROM:  A/PROM RIGHT   eval   Shoulder extension 48  Shoulder flexion 156  Shoulder abduction 159  Shoulder internal rotation 84  Shoulder external rotation 89    (Blank rows = not tested)  A/PROM LEFT   eval  Shoulder extension 60  Shoulder flexion 159  Shoulder abduction 180  Shoulder internal rotation 78  Shoulder external rotation 98    (Blank rows = not tested)  CERVICAL AROM: All within normal limits:      UPPER EXTREMITY STRENGTH: WFL  LYMPHEDEMA ASSESSMENTS:   SURGERY TYPE/DATE: Right lumpectomy with SLNB  NUMBER OF LYMPH NODES REMOVED: 8  CHEMOTHERAPY: no  RADIATION:yes  HORMONE TREATMENT: YES  INFECTIONS: NO  LYMPHEDEMA ASSESSMENTS:   LANDMARK RIGHT  eval  10 cm proximal to olecranon process 27.0  Olecranon process 22.7  10 cm proximal to ulnar styloid process 20.1  Just proximal to ulnar styloid process 14.8  Across hand at thumb web space 18.7  At base of 2nd digit 6.2  (Blank rows = not tested)  LANDMARK LEFT  eval  10 cm proximal to olecranon process 27.3  Olecranon process 22.5  10 cm proximal to ulnar styloid process 20.5  Just proximal to ulnar styloid process 14.1  Across hand at thumb web space 18.9  At base of 2nd digit 6.35  (Blank rows = not tested)  QUICK DASH SURVEY: 11.36   TODAY'S TREATMENT:    07/20/2022: Trigger Point Dry-Needling  Treatment instructions: Expect mild to moderate muscle soreness. S/S of pneumothorax if dry needled over a lung field, and to seek immediate medical attention should they occur. Patient verbalized understanding of these instructions and education.  Patient Consent Given: Yes Education handout provided: Yes Muscles treated: right upper trap, right rhomboid, right pec, right lats, right delts Electrical stimulation performed: No Parameters: N/A Treatment response/outcome: Utilized skilled palpation to locate/identify trigger points.  Able to illicit twitch response and muscle elongation with dry needling. Followed with soft tissue mobilization to right upper quadrant and axilla to further encourage tissue elongation.   07/19/2022 MFR to right axilla and upper arm areas of cording with longitudinal and S technique Soft tissue mobilization to right pectorals, UT and lats PROM right shoulder with MFR at axilla   07/15/2022: Trigger Point Dry-Needling  Treatment instructions: Expect mild to moderate  muscle soreness. S/S of pneumothorax if dry needled over a lung field, and to seek immediate medical attention should they occur. Patient verbalized understanding of these instructions and education.  Patient Consent Given: Yes Education handout provided: Yes Muscles treated: right upper trap, right rhomboid, right pec, right lats, right delts Electrical stimulation performed: No Parameters: N/A Treatment response/outcome: Utilized skilled palpation to locate/identify trigger points.  Able to illicit  twitch response and muscle elongation with dry needling. Followed with soft tissue mobilization to right upper quadrant and axilla to further encourage tissue elongation.      PATIENT EDUCATION:  Education details: scar massage, foam pad in compression bra to decrease slightly firm area, stretch 2-3 times per day, snow angels in supine, supine over foam roll with arms outstretched, supine over foam roll alternating flexion Person educated: Patient Education method: Explanation Education comprehension: verbalized understanding and returned demonstration  HOME EXERCISE PROGRAM: Single arm chest stretch, lower trunk rotation with arms extended and goal post, SB stretch to the left, wall slide for abduction  ASSESSMENT:  CLINICAL IMPRESSION: Ms Litzinger presents to skilled PT for dry needling and manual therapy.  Patient reports that she did note increased looseness following the first dry needling session.  Patient with strong twitch response noted with right upper trap and rhomboids.  Patient continues to have looseness noted following dry needling.  Patient with decreased cording noted following her session yesterday than compared to when this PT last worked with patient for last dry needling session.  Patient continues to require skilled PT to progress towards goal related activities.   OBJECTIVE IMPAIRMENTS: decreased knowledge of condition, decreased ROM, impaired flexibility, impaired  sensation, and impaired UE functional use.   ACTIVITY LIMITATIONS:  able to do all but with increased tightness  PARTICIPATION LIMITATIONS:  none but with increased tightness  PERSONAL FACTORS: Time since onset of injury/illness/exacerbation and 1-2 comorbidities: right breast cancer s/p radiation  are also affecting patient's functional outcome.   REHAB POTENTIAL: Excellent  CLINICAL DECISION MAKING: Stable/uncomplicated  EVALUATION COMPLEXITY: Low  GOALS: Goals reviewed with patient? Yes LONG TERM GOALS: Target date: 08/24/2022   Pt will be compliant with stretching HEP on a regular basis 2-3 times/day Baseline:  Goal status: IN PROGRESS  2.  Pt will have full AROM right shoulder with 0-min complaints of tightness Baseline:  Goal status: IN PROGRESS  3.  Pt will have decreased tenderness at incision after regular performance of scar massage Baseline:  Goal status: INITIAL    PLAN:  PT FREQUENCY: 1-2x  PT DURATION: 8 weeks PLANNED INTERVENTIONS: Therapeutic exercises, Patient/Family education, Self Care, Orthotic/Fit training, scar mobilization, and Manual therapy  PLAN FOR NEXT SESSION:  Assess response to dry needling, Continue STM to right UQ esp. Pectorals, lats, standing lat stretch, Dry needling, Scapular strengthening with band, PROM prn,   Juel Burrow, PT 07/20/2022, 4:13 PM  [facility] [address] [place], [place] [address] Phone # [phone] Fax [phone]

## 2022-07-26 ENCOUNTER — Ambulatory Visit: Payer: No Typology Code available for payment source | Attending: General Surgery

## 2022-07-26 DIAGNOSIS — Z483 Aftercare following surgery for neoplasm: Secondary | ICD-10-CM | POA: Insufficient documentation

## 2022-07-26 DIAGNOSIS — R293 Abnormal posture: Secondary | ICD-10-CM | POA: Diagnosis present

## 2022-07-26 DIAGNOSIS — M25611 Stiffness of right shoulder, not elsewhere classified: Secondary | ICD-10-CM | POA: Diagnosis not present

## 2022-07-26 DIAGNOSIS — M79601 Pain in right arm: Secondary | ICD-10-CM | POA: Diagnosis present

## 2022-07-26 DIAGNOSIS — Z170 Estrogen receptor positive status [ER+]: Secondary | ICD-10-CM | POA: Insufficient documentation

## 2022-07-26 DIAGNOSIS — C50411 Malignant neoplasm of upper-outer quadrant of right female breast: Secondary | ICD-10-CM | POA: Diagnosis present

## 2022-07-26 NOTE — Therapy (Signed)
OUTPATIENT PHYSICAL THERAPY ONCOLOGY TREATMENT  Patient Name: Kathryn Mckenzie MRN: [id number] DOB:1981/07/16, 42 y.o., female Today's Date: 07/26/2022   PT End of Session - 07/26/22 0904     Visit Number 8    Number of Visits 18    Date for PT Re-Evaluation 08/24/22    PT Start Time 0906    PT Stop Time 0957    PT Time Calculation (min) 51 min    Activity Tolerance Patient tolerated treatment well    Behavior During Therapy WFL for tasks assessed/performed              Past Medical History:  Diagnosis Date   Anxiety    Breast cancer ([place])    PONV (postoperative nausea and vomiting)    Past Surgical History:  Procedure Laterality Date   BREAST LUMPECTOMY WITH RADIOACTIVE SEED AND SENTINEL LYMPH NODE BIOPSY Right 11/10/2021   Procedure: RIGHT BREAST LUMPECTOMY WITH RADIOACTIVE SEED AND SENTINEL LYMPH NODE BIOPSY;  Surgeon: Jovita Kussmaul, MD;  Location: [facility];  Service: General;  Laterality: Right;   [place]     Patient Active Problem List   Diagnosis Date Noted   Genetic testing 09/20/2021   Family history of breast cancer 09/09/2021   Family history of pancreatic cancer 09/09/2021   Malignant neoplasm of upper-outer quadrant of right breast in female, estrogen receptor positive ([place]) 09/07/2021    PCP: Holland Commons FNP  REFERRING PROVIDER: Wilber Bihari, NP  REFERRING DIAG: Post radiation tightness/fibrosis  THERAPY DIAG:  Stiffness of right shoulder, not elsewhere classified  Pain in right arm  Aftercare following surgery for neoplasm  Malignant neoplasm of upper-outer quadrant of right breast in female, estrogen receptor positive ([place])  Abnormal posture  ONSET DATE: 12/2021  Rationale for Evaluation and Treatment: Rehabilitation  SUBJECTIVE:                                                                                                                                                                                            SUBJECTIVE STATEMENT: After DN last visit she got more out of my shoulder and neck. I can tell my chest feels more open, but I still feel stretch in my axillary region. My upper traps feel really tight. I go back to work today. I have been doing the foam roller stretches. I have had more random shooting pain in my right breast  I am going to work with a trainer for 2 days a week    PERTINENT HISTORY:  Patient was diagnosed on 08/30/2021 with right grade I invasive ductal carcinoma breast  cancer. She underwent a right lumpectomy and sentinel node biopsy (8 negative nodes) on 11/10/2021. It is ER/PR positive and HER2 negative with a Ki67 of 1% . She had 4 weeks of radiation which ended on 01/20/2022 PAIN:  Are you having pain? Yes 3/10 with use.  Primarily tightness, discomfort with stretching  PRECAUTIONS: Right UE lymphedema risk  WEIGHT BEARING RESTRICTIONS: No  FALLS:  Has patient fallen in last 6 months? No  LIVING ENVIRONMENT: Lives with: husband, 10 and 48 year old children Lives in: House/apartment Stairs: Yes; Internal: 12-15 steps; none and External: 5 steps; can reach both Has following equipment at home: none  OCCUPATION: interior design  LEISURE: Pure Barre?  HAND DOMINANCE: right   PRIOR LEVEL OF FUNCTION: Independent  PATIENT GOALS: Decrease chest tightness/discomfort, check firmness around scar   OBJECTIVE:  COGNITION: Overall cognitive status: Within functional limits for tasks assessed   PALPATION: Tender pec minor, sternal and clavicular pec major, lateral trunk, slight scar thickening under incision, tighter at lateral incision  OBSERVATIONS / OTHER ASSESSMENTS: No visible swelling, mild hyperpigmentation from radiation  SENSATION: Light touch: Deficits    POSTURE: forward head, round shoulders  UPPER EXTREMITY AROM/PROM:  A/PROM RIGHT   eval  RIGHT 07/26/2022  Shoulder extension 48 60  Shoulder flexion 156 165  Shoulder  abduction 159 172  Shoulder internal rotation 84   Shoulder external rotation 89     (Blank rows = not tested)  A/PROM LEFT   eval  Shoulder extension 60  Shoulder flexion 159  Shoulder abduction 180  Shoulder internal rotation 78  Shoulder external rotation 98    (Blank rows = not tested)  CERVICAL AROM: All within normal limits:     UPPER EXTREMITY STRENGTH: WFL  LYMPHEDEMA ASSESSMENTS:   SURGERY TYPE/DATE: Right lumpectomy with SLNB  NUMBER OF LYMPH NODES REMOVED: 8  CHEMOTHERAPY: no  RADIATION:yes  HORMONE TREATMENT: YES  INFECTIONS: NO  LYMPHEDEMA ASSESSMENTS:   LANDMARK RIGHT  eval  10 cm proximal to olecranon process 27.0  Olecranon process 22.7  10 cm proximal to ulnar styloid process 20.1  Just proximal to ulnar styloid process 14.8  Across hand at thumb web space 18.7  At base of 2nd digit 6.2  (Blank rows = not tested)  LANDMARK LEFT  eval  10 cm proximal to olecranon process 27.3  Olecranon process 22.5  10 cm proximal to ulnar styloid process 20.5  Just proximal to ulnar styloid process 14.1  Across hand at thumb web space 18.9  At base of 2nd digit 6.35  (Blank rows = not tested)  QUICK DASH SURVEY: 11.36   TODAY'S TREATMENT:    07/26/2022 Discussed return to gym activities and working with trainer. Gave ABC strength as guideline Soft tissue mobilization to right pectorals,  Bilateral UT and  right lats and SL to right UT, Lats, scapular area MFR to right axilla and upper arm areas of cording with longitudinal and S technique PROM right shoulder with MFR at axilla Measured ROM  07/20/2022: Trigger Point Dry-Needling  Treatment instructions: Expect mild to moderate muscle soreness. S/S of pneumothorax if dry needled over a lung field, and to seek immediate medical attention should they occur. Patient verbalized understanding of these instructions and education.  Patient Consent Given: Yes Education handout provided: Yes Muscles  treated: right upper trap, right rhomboid, right pec, right lats, right delts Electrical stimulation performed: No Parameters: N/A Treatment response/outcome: Utilized skilled palpation to locate/identify trigger points.  Able to illicit twitch  response and muscle elongation with dry needling. Followed with soft tissue mobilization to right upper quadrant and axilla to further encourage tissue elongation.   07/19/2022 MFR to right axilla and upper arm areas of cording with longitudinal and S technique Soft tissue mobilization to right pectorals, UT and lats PROM right shoulder with MFR at axilla   07/15/2022: Trigger Point Dry-Needling  Treatment instructions: Expect mild to moderate muscle soreness. S/S of pneumothorax if dry needled over a lung field, and to seek immediate medical attention should they occur. Patient verbalized understanding of these instructions and education.  Patient Consent Given: Yes Education handout provided: Yes Muscles treated: right upper trap, right rhomboid, right pec, right lats, right delts Electrical stimulation performed: No Parameters: N/A Treatment response/outcome: Utilized skilled palpation to locate/identify trigger points.  Able to illicit twitch response and muscle elongation with dry needling. Followed with soft tissue mobilization to right upper quadrant and axilla to further encourage tissue elongation.      PATIENT EDUCATION:  Education details: scar massage, foam pad in compression bra to decrease slightly firm area, stretch 2-3 times per day, snow angels in supine, supine over foam roll with arms outstretched, supine over foam roll alternating flexion Person educated: Patient Education method: Explanation Education comprehension: verbalized understanding and returned demonstration  HOME EXERCISE PROGRAM: Single arm chest stretch, lower trunk rotation with arms extended and goal post, SB stretch to the left, wall slide for  abduction  ASSESSMENT:  CLINICAL IMPRESSION:  Cording less prevalent today and decreased tissue tension in pectorals, however, bilateral UT's were very tight today and there was increased tension in lats.  Very good increase in Right shoulder ROM. Pt will try one more DN session. OBJECTIVE IMPAIRMENTS: decreased knowledge of condition, decreased ROM, impaired flexibility, impaired sensation, and impaired UE functional use.   ACTIVITY LIMITATIONS:  able to do all but with increased tightness  PARTICIPATION LIMITATIONS:  none but with increased tightness  PERSONAL FACTORS: Time since onset of injury/illness/exacerbation and 1-2 comorbidities: right breast cancer s/p radiation  are also affecting patient's functional outcome.   REHAB POTENTIAL: Excellent  CLINICAL DECISION MAKING: Stable/uncomplicated  EVALUATION COMPLEXITY: Low  GOALS: Goals reviewed with patient? Yes LONG TERM GOALS: Target date: 08/24/2022   Pt will be compliant with stretching HEP on a regular basis 2-3 times/day Baseline:  Goal status: IN PROGRESS  2.  Pt will have full AROM right shoulder with 0-min complaints of tightness Baseline:  Goal status: IN PROGRESS  3.  Pt will have decreased tenderness at incision after regular performance of scar massage Baseline:  Goal status: INITIAL    PLAN:  PT FREQUENCY: 1-2x  PT DURATION: 8 weeks PLANNED INTERVENTIONS: Therapeutic exercises, Patient/Family education, Self Care, Orthotic/Fit training, scar mobilization, and Manual therapy  PLAN FOR NEXT SESSION:  Assess response to dry needling, Continue STM to right UQ esp. Pectorals, lats, standing lat stretch, Dry needling, Scapular strengthening with band, PROM prn,   Claris Pong, PT 07/26/2022, 9:58 AM  [facility] [address], [address] [place], [place] [address] Phone # [phone] Fax [facility] [place]  [place], [place], [address] Phone: [phone]   Fax:  [phone]  Patient Details  Name: Kathryn Mckenzie MRN: [id number] Date of Birth: 1981/04/28 Referring Provider:  Jovita Kussmaul, MD  Encounter Date: 07/26/2022   Claris Pong, PT 07/26/2022, 9:58 AM  [facility] [place]  [place], [place], [address] Phone: [phone]   Fax:  [phone]

## 2022-07-28 ENCOUNTER — Ambulatory Visit: Payer: No Typology Code available for payment source | Admitting: Rehabilitative and Restorative Service Providers"

## 2022-07-28 ENCOUNTER — Encounter: Payer: Self-pay | Admitting: Rehabilitative and Restorative Service Providers"

## 2022-07-28 DIAGNOSIS — M25611 Stiffness of right shoulder, not elsewhere classified: Secondary | ICD-10-CM

## 2022-07-28 DIAGNOSIS — M79601 Pain in right arm: Secondary | ICD-10-CM

## 2022-07-28 NOTE — Therapy (Signed)
OUTPATIENT PHYSICAL THERAPY ONCOLOGY TREATMENT  Patient Name: Kathryn Mckenzie MRN: [id number] DOB:12-07-1980, 42 y.o., female Today's Date: 07/28/2022   PT End of Session - 07/28/22 0945     Visit Number 9    Date for PT Re-Evaluation 08/24/22    PT Start Time 0945    PT Stop Time 1010    PT Time Calculation (min) 25 min    Activity Tolerance Patient tolerated treatment well    Behavior During Therapy WFL for tasks assessed/performed              Past Medical History:  Diagnosis Date   Anxiety    Breast cancer ([place])    PONV (postoperative nausea and vomiting)    Past Surgical History:  Procedure Laterality Date   BREAST LUMPECTOMY WITH RADIOACTIVE SEED AND SENTINEL LYMPH NODE BIOPSY Right 11/10/2021   Procedure: RIGHT BREAST LUMPECTOMY WITH RADIOACTIVE SEED AND SENTINEL LYMPH NODE BIOPSY;  Surgeon: Jovita Kussmaul, MD;  Location: [facility];  Service: General;  Laterality: Right;   [place]     Patient Active Problem List   Diagnosis Date Noted   Genetic testing 09/20/2021   Family history of breast cancer 09/09/2021   Family history of pancreatic cancer 09/09/2021   Malignant neoplasm of upper-outer quadrant of right breast in female, estrogen receptor positive ([place]) 09/07/2021    PCP: Holland Commons FNP  REFERRING PROVIDER: Wilber Bihari, NP  REFERRING DIAG: Post radiation tightness/fibrosis  THERAPY DIAG:  Stiffness of right shoulder, not elsewhere classified  Pain in right arm  ONSET DATE: 12/2021  Rationale for Evaluation and Treatment: Rehabilitation  SUBJECTIVE:                                                                                                                                                                                           SUBJECTIVE STATEMENT: Lacresia states that she has initiated personal training and her trainer is using light weights and she can still feel the soreness/muscle fatigue  following.    PERTINENT HISTORY:  Patient was diagnosed on 08/30/2021 with right grade I invasive ductal carcinoma breast cancer. She underwent a right lumpectomy and sentinel node biopsy (8 negative nodes) on 11/10/2021. It is ER/PR positive and HER2 negative with a Ki67 of 1% . She had 4 weeks of radiation which ended on 01/20/2022 PAIN:  Are you having pain? Yes 3-4/10 with use.  Primarily tightness, discomfort with stretching  PRECAUTIONS: Right UE lymphedema risk  WEIGHT BEARING RESTRICTIONS: No  FALLS:  Has patient fallen in last 6 months? No  LIVING ENVIRONMENT: Lives with: husband,  34 and 53 year old children Lives in: House/apartment Stairs: Yes; Internal: 12-15 steps; none and External: 5 steps; can reach both Has following equipment at home: none  OCCUPATION: interior design  LEISURE: Pure Barre?  HAND DOMINANCE: right   PRIOR LEVEL OF FUNCTION: Independent  PATIENT GOALS: Decrease chest tightness/discomfort, check firmness around scar   OBJECTIVE:  COGNITION: Overall cognitive status: Within functional limits for tasks assessed   PALPATION: Tender pec minor, sternal and clavicular pec major, lateral trunk, slight scar thickening under incision, tighter at lateral incision  OBSERVATIONS / OTHER ASSESSMENTS: No visible swelling, mild hyperpigmentation from radiation  SENSATION: Light touch: Deficits    POSTURE: forward head, round shoulders  UPPER EXTREMITY AROM/PROM:  A/PROM RIGHT   eval  RIGHT 07/26/2022  Shoulder extension 48 60  Shoulder flexion 156 165  Shoulder abduction 159 172  Shoulder internal rotation 84   Shoulder external rotation 89     (Blank rows = not tested)  A/PROM LEFT   eval  Shoulder extension 60  Shoulder flexion 159  Shoulder abduction 180  Shoulder internal rotation 78  Shoulder external rotation 98    (Blank rows = not tested)  CERVICAL AROM: All within normal limits:     UPPER EXTREMITY STRENGTH:  WFL  LYMPHEDEMA ASSESSMENTS:   SURGERY TYPE/DATE: Right lumpectomy with SLNB  NUMBER OF LYMPH NODES REMOVED: 8  CHEMOTHERAPY: no  RADIATION:yes  HORMONE TREATMENT: YES  INFECTIONS: NO  LYMPHEDEMA ASSESSMENTS:   LANDMARK RIGHT  eval  10 cm proximal to olecranon process 27.0  Olecranon process 22.7  10 cm proximal to ulnar styloid process 20.1  Just proximal to ulnar styloid process 14.8  Across hand at thumb web space 18.7  At base of 2nd digit 6.2  (Blank rows = not tested)  LANDMARK LEFT  eval  10 cm proximal to olecranon process 27.3  Olecranon process 22.5  10 cm proximal to ulnar styloid process 20.5  Just proximal to ulnar styloid process 14.1  Across hand at thumb web space 18.9  At base of 2nd digit 6.35  (Blank rows = not tested)  QUICK DASH SURVEY: 11.36   TODAY'S TREATMENT:    07/28/2022: Trigger Point Dry-Needling  Treatment instructions: Expect mild to moderate muscle soreness. S/S of pneumothorax if dry needled over a lung field, and to seek immediate medical attention should they occur. Patient verbalized understanding of these instructions and education.  Patient Consent Given: Yes Education handout provided: Yes Muscles treated: bilat upper trap, bilat rhomboid, bilat lats Electrical stimulation performed: No Parameters: N/A Treatment response/outcome: Utilized skilled palpation to locate/identify trigger points.  Able to illicit twitch response and muscle elongation with dry needling. Followed with soft tissue mobilization to right upper quadrant and axilla to further encourage tissue elongation.   07/26/2022 Discussed return to gym activities and working with trainer. Gave ABC strength as guideline Soft tissue mobilization to right pectorals,  Bilateral UT and  right lats and SL to right UT, Lats, scapular area MFR to right axilla and upper arm areas of cording with longitudinal and S technique PROM right shoulder with MFR at axilla Measured  ROM  07/20/2022: Trigger Point Dry-Needling  Treatment instructions: Expect mild to moderate muscle soreness. S/S of pneumothorax if dry needled over a lung field, and to seek immediate medical attention should they occur. Patient verbalized understanding of these instructions and education.  Patient Consent Given: Yes Education handout provided: Yes Muscles treated: right upper trap, right rhomboid, right pec, right lats, right  delts Electrical stimulation performed: No Parameters: N/A Treatment response/outcome: Utilized skilled palpation to locate/identify trigger points.  Able to illicit twitch response and muscle elongation with dry needling. Followed with soft tissue mobilization to right upper quadrant and axilla to further encourage tissue elongation.      PATIENT EDUCATION:  Education details: scar massage, foam pad in compression bra to decrease slightly firm area, stretch 2-3 times per day, snow angels in supine, supine over foam roll with arms outstretched, supine over foam roll alternating flexion Person educated: Patient Education method: Explanation Education comprehension: verbalized understanding and returned demonstration  HOME EXERCISE PROGRAM: Single arm chest stretch, lower trunk rotation with arms extended and goal post, SB stretch to the left, wall slide for abduction  ASSESSMENT:  CLINICAL IMPRESSION: Annaliz presents to dry needling session with reports of overall progress.  Added dry needling of left side, in addition, to facilitate further return of muscle functioning in correct body mechanics.  Patient with strong twitch response noted on bilateral upper traps.  Dry needling followed with utilizing soft tissue mobilization and attempt to release cording noted along right axilla region.  Patient to continue with further strengthening with personal trainer and work with Oncology PT.  OBJECTIVE IMPAIRMENTS: decreased knowledge of condition, decreased ROM,  impaired flexibility, impaired sensation, and impaired UE functional use.   ACTIVITY LIMITATIONS:  able to do all but with increased tightness  PARTICIPATION LIMITATIONS:  none but with increased tightness  PERSONAL FACTORS: Time since onset of injury/illness/exacerbation and 1-2 comorbidities: right breast cancer s/p radiation  are also affecting patient's functional outcome.   REHAB POTENTIAL: Excellent  CLINICAL DECISION MAKING: Stable/uncomplicated  EVALUATION COMPLEXITY: Low  GOALS: Goals reviewed with patient? Yes LONG TERM GOALS: Target date: 08/24/2022   Pt will be compliant with stretching HEP on a regular basis 2-3 times/day Baseline:  Goal status: IN PROGRESS  2.  Pt will have full AROM right shoulder with 0-min complaints of tightness Baseline:  Goal status: IN PROGRESS  3.  Pt will have decreased tenderness at incision after regular performance of scar massage Baseline:  Goal status: INITIAL    PLAN:  PT FREQUENCY: 1-2x  PT DURATION: 8 weeks PLANNED INTERVENTIONS: Therapeutic exercises, Patient/Family education, Self Care, Orthotic/Fit training, scar mobilization, and Manual therapy  PLAN FOR NEXT SESSION:  Assess response to dry needling, Continue STM to right UQ esp. Pectorals, lats, standing lat stretch, Dry needling, Scapular strengthening with band, PROM prn,   Juel Burrow, PT 07/28/2022, 10:18 AM  [facility] [address] [place], [place] [address] Phone # [phone] Fax [phone]

## 2022-08-01 ENCOUNTER — Ambulatory Visit: Payer: No Typology Code available for payment source

## 2022-08-01 DIAGNOSIS — Z170 Estrogen receptor positive status [ER+]: Secondary | ICD-10-CM

## 2022-08-01 DIAGNOSIS — M79601 Pain in right arm: Secondary | ICD-10-CM

## 2022-08-01 DIAGNOSIS — M25611 Stiffness of right shoulder, not elsewhere classified: Secondary | ICD-10-CM

## 2022-08-01 DIAGNOSIS — Z483 Aftercare following surgery for neoplasm: Secondary | ICD-10-CM

## 2022-08-01 NOTE — Therapy (Addendum)
OUTPATIENT PHYSICAL THERAPY ONCOLOGY TREATMENT  Patient Name: Kathryn Mckenzie MRN: [id number] DOB:08-Jul-1981, 42 y.o., female Today's Date: 08/01/2022   PT End of Session - 08/01/22 0905     Visit Number 10    Number of Visits 18    Date for PT Re-Evaluation 08/24/22    PT Start Time 0906    PT Stop Time 0952    PT Time Calculation (min) 46 min    Activity Tolerance Patient tolerated treatment well    Behavior During Therapy [facility] for tasks assessed/performed              Past Medical History:  Diagnosis Date   Anxiety    Breast cancer ([place])    PONV (postoperative nausea and vomiting)    Past Surgical History:  Procedure Laterality Date   BREAST LUMPECTOMY WITH RADIOACTIVE SEED AND SENTINEL LYMPH NODE BIOPSY Right 11/10/2021   Procedure: RIGHT BREAST LUMPECTOMY WITH RADIOACTIVE SEED AND SENTINEL LYMPH NODE BIOPSY;  Surgeon: Jovita Kussmaul, MD;  Location: [facility];  Service: General;  Laterality: Right;   [place]     Patient Active Problem List   Diagnosis Date Noted   Genetic testing 09/20/2021   Family history of breast cancer 09/09/2021   Family history of pancreatic cancer 09/09/2021   Malignant neoplasm of upper-outer quadrant of right breast in female, estrogen receptor positive ([place]) 09/07/2021    PCP: Holland Commons FNP  REFERRING PROVIDER: Wilber Bihari, NP  REFERRING DIAG: Post radiation tightness/fibrosis  THERAPY DIAG:  Stiffness of right shoulder, not elsewhere classified  Pain in right arm  Aftercare following surgery for neoplasm  Malignant neoplasm of upper-outer quadrant of right breast in female, estrogen receptor positive ([place])  ONSET DATE: 12/2021  Rationale for Evaluation and Treatment: Rehabilitation  SUBJECTIVE:                                                                                                                                                                                            SUBJECTIVE STATEMENT: I had severe neck pain after the DN last Thursday. It hurt to swallow because my neck and shoulders were so sore. I stood under the hot shower and used a Production assistant, radio. It definitely feels better in my neck, but not a ton of difference in the arm. I really think it is the cording that is limiting me    PERTINENT HISTORY:  Patient was diagnosed on 08/30/2021 with right grade I invasive ductal carcinoma breast cancer. She underwent a right lumpectomy and sentinel node biopsy (8 negative nodes) on 11/10/2021. It is ER/PR positive  and HER2 negative with a Ki67 of 1% . She had 4 weeks of radiation which ended on 01/20/2022 PAIN:  Are you having pain? Yes 3-4/10 with use.  Primarily tightness, discomfort with stretching  PRECAUTIONS: Right UE lymphedema risk  WEIGHT BEARING RESTRICTIONS: No  FALLS:  Has patient fallen in last 6 months? No  LIVING ENVIRONMENT: Lives with: husband, 53 and 72 year old children Lives in: House/apartment Stairs: Yes; Internal: 12-15 steps; none and External: 5 steps; can reach both Has following equipment at home: none  OCCUPATION: interior design  LEISURE: Pure Barre?  HAND DOMINANCE: right   PRIOR LEVEL OF FUNCTION: Independent  PATIENT GOALS: Decrease chest tightness/discomfort, check firmness around scar   OBJECTIVE:  COGNITION: Overall cognitive status: Within functional limits for tasks assessed   PALPATION: Tender pec minor, sternal and clavicular pec major, lateral trunk, slight scar thickening under incision, tighter at lateral incision  OBSERVATIONS / OTHER ASSESSMENTS: No visible swelling, mild hyperpigmentation from radiation  SENSATION: Light touch: Deficits    POSTURE: forward head, round shoulders  UPPER EXTREMITY AROM/PROM:  A/PROM RIGHT   eval  RIGHT 07/26/2022  Shoulder extension 48 60  Shoulder flexion 156 165  Shoulder abduction 159 172  Shoulder internal rotation 84   Shoulder external rotation 89      (Blank rows = not tested)  A/PROM LEFT   eval  Shoulder extension 60  Shoulder flexion 159  Shoulder abduction 180  Shoulder internal rotation 78  Shoulder external rotation 98    (Blank rows = not tested)  CERVICAL AROM: All within normal limits:     UPPER EXTREMITY STRENGTH: WFL  LYMPHEDEMA ASSESSMENTS:   SURGERY TYPE/DATE: Right lumpectomy with SLNB  NUMBER OF LYMPH NODES REMOVED: 8  CHEMOTHERAPY: no  RADIATION:yes  HORMONE TREATMENT: YES  INFECTIONS: NO  LYMPHEDEMA ASSESSMENTS:   LANDMARK RIGHT  eval  10 cm proximal to olecranon process 27.0  Olecranon process 22.7  10 cm proximal to ulnar styloid process 20.1  Just proximal to ulnar styloid process 14.8  Across hand at thumb web space 18.7  At base of 2nd digit 6.2  (Blank rows = not tested)  LANDMARK LEFT  eval  10 cm proximal to olecranon process 27.3  Olecranon process 22.5  10 cm proximal to ulnar styloid process 20.5  Just proximal to ulnar styloid process 14.1  Across hand at thumb web space 18.9  At base of 2nd digit 6.35  (Blank rows = not tested)  QUICK DASH SURVEY: 11.36   TODAY'S TREATMENT:   08/01/2022  Soft tissue mobilization to right pectorals,  Bilateral UT and  right lats and SL to right UT, Lats, scapular area MFR to right axilla and upper arm areas of cording with longitudinal and S technique PROM right shoulder with MFR at axilla flexion, abduction, D2 flexion 4 D ball rolls on wall x 20 07/28/2022: Trigger Point Dry-Needling  Treatment instructions: Expect mild to moderate muscle soreness. S/S of pneumothorax if dry needled over a lung field, and to seek immediate medical attention should they occur. Patient verbalized understanding of these instructions and education.  Patient Consent Given: Yes Education handout provided: Yes Muscles treated: bilat upper trap, bilat rhomboid, bilat lats Electrical stimulation performed: No Parameters: N/A Treatment response/outcome:  Utilized skilled palpation to locate/identify trigger points.  Able to illicit twitch response and muscle elongation with dry needling. Followed with soft tissue mobilization to right upper quadrant and axilla to further encourage tissue elongation.   07/26/2022 Discussed return  to gym activities and working with trainer. Gave ABC strength as guideline Soft tissue mobilization to right pectorals,  Bilateral UT and  right lats and SL to right UT, Lats, scapular area MFR to right axilla and upper arm areas of cording with longitudinal and S technique PROM right shoulder with MFR at axilla Measured ROM  07/20/2022: Trigger Point Dry-Needling  Treatment instructions: Expect mild to moderate muscle soreness. S/S of pneumothorax if dry needled over a lung field, and to seek immediate medical attention should they occur. Patient verbalized understanding of these instructions and education.  Patient Consent Given: Yes Education handout provided: Yes Muscles treated: right upper trap, right rhomboid, right pec, right lats, right delts Electrical stimulation performed: No Parameters: N/A Treatment response/outcome: Utilized skilled palpation to locate/identify trigger points.  Able to illicit twitch response and muscle elongation with dry needling. Followed with soft tissue mobilization to right upper quadrant and axilla to further encourage tissue elongation.      PATIENT EDUCATION:  Education details: scar massage, foam pad in compression bra to decrease slightly firm area, stretch 2-3 times per day, snow angels in supine, supine over foam roll with arms outstretched, supine over foam roll alternating flexion Person educated: Patient Education method: Explanation Education comprehension: verbalized understanding and returned demonstration  HOME EXERCISE PROGRAM: Single arm chest stretch, lower trunk rotation with arms extended and goal post, SB stretch to the left, wall slide for  abduction  ASSESSMENT:  CLINICAL IMPRESSION: Pt was very sore in her neck for several days after last DN session.  Right shoulder ROM is doing well, but pt continues to be frustrated with tightness due to cording in the right axilla. She will hold on DN and will continue with PT for Cancer rehab 1x/week. She will add scapular stab exercises to her routine  OBJECTIVE IMPAIRMENTS: decreased knowledge of condition, decreased ROM, impaired flexibility, impaired sensation, and impaired UE functional use.   ACTIVITY LIMITATIONS:  able to do all but with increased tightness  PARTICIPATION LIMITATIONS:  none but with increased tightness  PERSONAL FACTORS: Time since onset of injury/illness/exacerbation and 1-2 comorbidities: right breast cancer s/p radiation  are also affecting patient's functional outcome.   REHAB POTENTIAL: Excellent  CLINICAL DECISION MAKING: Stable/uncomplicated  EVALUATION COMPLEXITY: Low  GOALS: Goals reviewed with patient? Yes LONG TERM GOALS: Target date: 08/24/2022   Pt will be compliant with stretching HEP on a regular basis 2-3 times/day Baseline:  Goal status: IN PROGRESS  2.  Pt will have full AROM right shoulder with 0-min complaints of tightness Baseline:  Goal status: IN PROGRESS  3.  Pt will have decreased tenderness at incision after regular performance of scar massage Baseline:  Goal status: INITIAL    PLAN:  PT FREQUENCY: 1-2x  PT DURATION: 8 weeks PLANNED INTERVENTIONS: Therapeutic exercises, Patient/Family education, Self Care, Orthotic/Fit training, scar mobilization, and Manual therapy  PLAN FOR NEXT SESSION:   Continue STM to right UQ esp. Pectorals, lats, standing lat stretch, HoldDry needling,  scapular stabs,Scapular strengthening with band, PROM prn,   Claris Pong, PT 08/01/2022, 9:57 AM  [facility] [address] [place] [address] [place], [place] [address] Phone # [phone] Fax [phone]

## 2022-08-05 ENCOUNTER — Ambulatory Visit: Payer: No Typology Code available for payment source | Admitting: Rehabilitative and Restorative Service Providers"

## 2022-08-10 ENCOUNTER — Ambulatory Visit: Payer: No Typology Code available for payment source

## 2022-08-10 DIAGNOSIS — R293 Abnormal posture: Secondary | ICD-10-CM

## 2022-08-10 DIAGNOSIS — C50411 Malignant neoplasm of upper-outer quadrant of right female breast: Secondary | ICD-10-CM

## 2022-08-10 DIAGNOSIS — M79601 Pain in right arm: Secondary | ICD-10-CM

## 2022-08-10 DIAGNOSIS — Z483 Aftercare following surgery for neoplasm: Secondary | ICD-10-CM

## 2022-08-10 DIAGNOSIS — M25611 Stiffness of right shoulder, not elsewhere classified: Secondary | ICD-10-CM | POA: Diagnosis not present

## 2022-08-10 NOTE — Therapy (Addendum)
OUTPATIENT PHYSICAL THERAPY ONCOLOGY TREATMENT  Patient Name: Kathryn Mckenzie MRN: [id number] DOB:08-Feb-1981, 42 y.o., female Today's Date: 08/10/2022   PT End of Session - 08/10/22 0906     Visit Number 11    Number of Visits 18    Date for PT Re-Evaluation 08/24/22    PT Start Time 0907    PT Stop Time 0955    PT Time Calculation (min) 48 min    Activity Tolerance Patient tolerated treatment well    Behavior During Therapy [facility] for tasks assessed/performed              Past Medical History:  Diagnosis Date   Anxiety    Breast cancer (HCC)    PONV (postoperative nausea and vomiting)    Past Surgical History:  Procedure Laterality Date   BREAST LUMPECTOMY WITH RADIOACTIVE SEED AND SENTINEL LYMPH NODE BIOPSY Right 11/10/2021   Procedure: RIGHT BREAST LUMPECTOMY WITH RADIOACTIVE SEED AND SENTINEL LYMPH NODE BIOPSY;  Surgeon: Griselda Miner, MD;  Location: [facility];  Service: General;  Laterality: Right;   CESAREAN SECTION     TONSILLECTOMY     Patient Active Problem List   Diagnosis Date Noted   Genetic testing 09/20/2021   Family history of breast cancer 09/09/2021   Family history of pancreatic cancer 09/09/2021   Malignant neoplasm of upper-outer quadrant of right breast in female, estrogen receptor positive (HCC) 09/07/2021    PCP: Fatima Sanger FNP  REFERRING PROVIDER: Lillard Anes, NP  REFERRING DIAG: Post radiation tightness/fibrosis  THERAPY DIAG:  Stiffness of right shoulder, not elsewhere classified  Pain in right arm  Aftercare following surgery for neoplasm  Malignant neoplasm of upper-outer quadrant of right breast in female, estrogen receptor positive (HCC)  Abnormal posture  ONSET DATE: 12/2021  Rationale for Evaluation and Treatment: Rehabilitation  SUBJECTIVE:                                                                                                                                                                                            SUBJECTIVE STATEMENT:  I felt really good last week after I was here and toward the end of the week. I have tightened up a little bit but overall a lot better. I have seen the trainer 2 xs a week and that is going well. I have not had any pain down my arm.   PERTINENT HISTORY:  Patient was diagnosed on 08/30/2021 with right grade I invasive ductal carcinoma breast cancer. She underwent a right lumpectomy and sentinel node biopsy (8 negative nodes) on 11/10/2021. It is ER/PR positive and HER2 negative with  a Ki67 of 1% . She had 4 weeks of radiation which ended on 01/20/2022 PAIN:  Are you having pain? No not presently .  Primarily tightness, discomfort with stretching  PRECAUTIONS: Right UE lymphedema risk  WEIGHT BEARING RESTRICTIONS: No  FALLS:  Has patient fallen in last 6 months? No  LIVING ENVIRONMENT: Lives with: husband, 79 and 70 year old children Lives in: House/apartment Stairs: Yes; Internal: 12-15 steps; none and External: 5 steps; can reach both Has following equipment at home: none  OCCUPATION: interior design  LEISURE: Pure [place]re?  HAND DOMINANCE: right   PRIOR LEVEL OF FUNCTION: Independent  PATIENT GOALS: Decrease chest tightness/discomfort, check firmness around scar   OBJECTIVE:  COGNITION: Overall cognitive status: Within functional limits for tasks assessed   PALPATION: Tender pec minor, sternal and clavicular pec major, lateral trunk, slight scar thickening under incision, tighter at lateral incision  OBSERVATIONS / OTHER ASSESSMENTS: No visible swelling, mild hyperpigmentation from radiation  SENSATION: Light touch: Deficits    POSTURE: forward head, round shoulders  UPPER EXTREMITY AROM/PROM:  A/PROM RIGHT   eval  RIGHT 07/26/2022  Shoulder extension 48 60  Shoulder flexion 156 165  Shoulder abduction 159 172  Shoulder internal rotation 84   Shoulder external rotation 89     (Blank rows = not tested)  A/PROM LEFT    eval  Shoulder extension 60  Shoulder flexion 159  Shoulder abduction 180  Shoulder internal rotation 78  Shoulder external rotation 98    (Blank rows = not tested)  CERVICAL AROM: All within normal limits:     UPPER EXTREMITY STRENGTH: WFL  LYMPHEDEMA ASSESSMENTS:   SURGERY TYPE/DATE: Right lumpectomy with SLNB  NUMBER OF LYMPH NODES REMOVED: 8  CHEMOTHERAPY: no  RADIATION:yes  HORMONE TREATMENT: YES  INFECTIONS: NO  LYMPHEDEMA ASSESSMENTS:   LANDMARK RIGHT  eval  10 cm proximal to olecranon process 27.0  Olecranon process 22.7  10 cm proximal to ulnar styloid process 20.1  Just proximal to ulnar styloid process 14.8  Across hand at thumb web space 18.7  At base of 2nd digit 6.2  (Blank rows = not tested)  LANDMARK LEFT  eval  10 cm proximal to olecranon process 27.3  Olecranon process 22.5  10 cm proximal to ulnar styloid process 20.5  Just proximal to ulnar styloid process 14.1  Across hand at thumb web space 18.9  At base of 2nd digit 6.35  (Blank rows = not tested)  QUICK DASH SURVEY: 11.36   TODAY'S TREATMENT:     08/10/2021 Left SB stretch x 3 to stretch axillary and lateral trunk on right, single arm pec stretch on wall x 3 Soft tissue mobilization to right pectorals,  Bilateral UT and  right lats and SL to right UT, Lats, scapular area MFR to right axilla and upper arm areas of cording with longitudinal and S technique PROM right shoulder with MFR at axilla flexion, abduction, D2 flexion Discussed wearing compression sleeve during workouts as preventative for lymphedema 08/01/2022  Soft tissue mobilization to right pectorals,  Bilateral UT and  right lats and SL to right UT, Lats, scapular area MFR to right axilla and upper arm areas of cording with longitudinal and S technique PROM right shoulder with MFR at axilla flexion, abduction, D2 flexion 4 D ball rolls on wall x 20 07/28/2022: Trigger Point Dry-Needling  Treatment instructions:  Expect mild to moderate muscle soreness. S/S of pneumothorax if dry needled over a lung field, and to seek immediate medical attention  should they occur. Patient verbalized understanding of these instructions and education.  Patient Consent Given: Yes Education handout provided: Yes Muscles treated: bilat upper trap, bilat rhomboid, bilat lats Electrical stimulation performed: No Parameters: N/A Treatment response/outcome: Utilized skilled palpation to locate/identify trigger points.  Able to illicit twitch response and muscle elongation with dry needling. Followed with soft tissue mobilization to right upper quadrant and axilla to further encourage tissue elongation.   07/26/2022 Discussed return to gym activities and working with trainer. Gave ABC strength as guideline Soft tissue mobilization to right pectorals,  Bilateral UT and  right lats and SL to right UT, Lats, scapular area MFR to right axilla and upper arm areas of cording with longitudinal and S technique PROM right shoulder with MFR at axilla Measured ROM  07/20/2022: Trigger Point Dry-Needling  Treatment instructions: Expect mild to moderate muscle soreness. S/S of pneumothorax if dry needled over a lung field, and to seek immediate medical attention should they occur. Patient verbalized understanding of these instructions and education.  Patient Consent Given: Yes Education handout provided: Yes Muscles treated: right upper trap, right rhomboid, right pec, right lats, right delts Electrical stimulation performed: No Parameters: N/A Treatment response/outcome: Utilized skilled palpation to locate/identify trigger points.  Able to illicit twitch response and muscle elongation with dry needling. Followed with soft tissue mobilization to right upper quadrant and axilla to further encourage tissue elongation.      PATIENT EDUCATION:  Education details: scar massage, foam pad in compression bra to decrease slightly firm  area, stretch 2-3 times per day, snow angels in supine, supine over foam roll with arms outstretched, supine over foam roll alternating flexion Person educated: Patient Education method: Explanation Education comprehension: verbalized understanding and returned demonstration  HOME EXERCISE PROGRAM: Single arm chest stretch, lower trunk rotation with arms extended and goal post, SB stretch to the left, wall slide for abduction  ASSESSMENT:  CLINICAL IMPRESSION: Pt had excellent response to last weeks visit and seems to be doing much better overall. Cording is still present in axillary region and does not appear to limit ROM, but does start feeling tight for her at times. She is tolerating good workouts with her trainer .  OBJECTIVE IMPAIRMENTS: decreased knowledge of condition, decreased ROM, impaired flexibility, impaired sensation, and impaired UE functional use.   ACTIVITY LIMITATIONS:  able to do all but with increased tightness  PARTICIPATION LIMITATIONS:  none but with increased tightness  PERSONAL FACTORS: Time since onset of injury/illness/exacerbation and 1-2 comorbidities: right breast cancer s/p radiation  are also affecting patient's functional outcome.   REHAB POTENTIAL: Excellent  CLINICAL DECISION MAKING: Stable/uncomplicated  EVALUATION COMPLEXITY: Low  GOALS: Goals reviewed with patient? Yes LONG TERM GOALS: Target date: 08/24/2022   Pt will be compliant with stretching HEP on a regular basis 2-3 times/day Baseline:  Goal status: IN PROGRESS  2.  Pt will have full AROM right shoulder with 0-min complaints of tightness Baseline:  Goal status: IN PROGRESS  3.  Pt will have decreased tenderness at incision after regular performance of scar massage Baseline:  Goal status: INITIAL    PLAN:  PT FREQUENCY: 1-2x  PT DURATION: 8 weeks PLANNED INTERVENTIONS: Therapeutic exercises, Patient/Family education, Self Care, Orthotic/Fit training, scar mobilization, and  Manual therapy  PLAN FOR NEXT SESSION:   Continue STM to right UQ esp. Pectorals, lats, standing lat stretch, HoldDry needling,  scapular stabs,Scapular strengthening with band, PROM prn, PHYSICAL THERAPY DISCHARGE SUMMARY  Visits from Start of Care: 11  Current  functional level related to goals / functional outcomes: Pt was making good progress toward goals and was back to working out with her trainer   Remaining deficits: Several cords remained but were not interfering with ROM   Education / Equipment: HEP/ theraband   Patient agrees to discharge. Patient goals were not met. Patient is being discharged due to not returning since the last visit.   Waynette Buttery, PT 08/10/2022, 9:57 AM  [facility] [address], [address] [place], [place] [address] Phone # [phone] Fax [phone]

## 2022-08-12 ENCOUNTER — Encounter: Payer: No Typology Code available for payment source | Admitting: Rehabilitative and Restorative Service Providers"

## 2022-08-17 ENCOUNTER — Ambulatory Visit: Payer: No Typology Code available for payment source

## 2022-08-31 ENCOUNTER — Other Ambulatory Visit: Payer: Self-pay | Admitting: Adult Health

## 2022-08-31 ENCOUNTER — Ambulatory Visit
Admission: RE | Admit: 2022-08-31 | Discharge: 2022-08-31 | Disposition: A | Discharge status: Home / Self Care | Payer: No Typology Code available for payment source | Source: Ambulatory Visit | Attending: Adult Health | Admitting: Adult Health

## 2022-08-31 DIAGNOSIS — C50411 Malignant neoplasm of upper-outer quadrant of right female breast: Secondary | ICD-10-CM

## 2022-08-31 DIAGNOSIS — N6489 Other specified disorders of breast: Secondary | ICD-10-CM

## 2022-08-31 HISTORY — DX: Personal history of irradiation: Z92.3

## 2022-09-05 ENCOUNTER — Ambulatory Visit: Payer: No Typology Code available for payment source | Attending: General Surgery

## 2022-09-05 VITALS — Wt 127.0 lb

## 2022-09-05 DIAGNOSIS — Z483 Aftercare following surgery for neoplasm: Secondary | ICD-10-CM | POA: Insufficient documentation

## 2022-09-05 NOTE — Therapy (Signed)
  OUTPATIENT PHYSICAL THERAPY SOZO SCREENING NOTE   Patient Name: Kathryn Mckenzie MRN: [id number] DOB:Jun 27, 1981, 42 y.o., female Today's Date: 09/05/2022  PCP: Holland Commons, FNP REFERRING PROVIDER: Autumn Messing III, MD   PT End of Session - 09/05/22 0908     Visit Number 11   # unchanged due to screen only   PT Start Time 0858    PT Stop Time 0905    PT Time Calculation (min) 7 min    Activity Tolerance Patient tolerated treatment well    Behavior During Therapy [facility] for tasks assessed/performed             Past Medical History:  Diagnosis Date   Anxiety    Breast cancer ([place])    Personal history of radiation therapy    PONV (postoperative nausea and vomiting)    Past Surgical History:  Procedure Laterality Date   BREAST BIOPSY Bilateral    2023   BREAST LUMPECTOMY Right 2023   BREAST LUMPECTOMY WITH RADIOACTIVE SEED AND SENTINEL LYMPH NODE BIOPSY Right 11/10/2021   Procedure: RIGHT BREAST LUMPECTOMY WITH RADIOACTIVE SEED AND SENTINEL LYMPH NODE BIOPSY;  Surgeon: Jovita Kussmaul, MD;  Location: [place] [facility];  Service: General;  Laterality: Right;   CESAREAN SECTION     TONSILLECTOMY     Patient Active Problem List   Diagnosis Date Noted   Genetic testing 09/20/2021   Family history of breast cancer 09/09/2021   Family history of pancreatic cancer 09/09/2021   Malignant neoplasm of upper-outer quadrant of right breast in female, estrogen receptor positive ([place]) 09/07/2021    REFERRING DIAG: right breast cancer at risk for lymphedema  THERAPY DIAG:  Aftercare following surgery for neoplasm  PERTINENT HISTORY: Patient was diagnosed on 08/30/2021 with right grade I invasive ductal carcinoma breast cancer. She underwent a right lumpectomy and sentinel node biopsy (8 negative nodes) on 11/10/2021. It is ER/PR positive and HER2 negative with a Ki67 of 1% . She had 4 weeks of radiation which ended on 01/20/2022   PRECAUTIONS: right UE Lymphedema risk,  None  SUBJECTIVE: Pt returns for her 3 month L-Dex screen.   PAIN:  Are you having pain? No  SOZO SCREENING: Patient was assessed today using the SOZO machine to determine the lymphedema index score. This was compared to her baseline score. It was determined that she is within the recommended range when compared to her baseline and no further action is needed at this time. She will continue SOZO screenings. These are done every 3 months for 2 years post operatively followed by every 6 months for 2 years, and then annually.   L-DEX FLOWSHEETS - 09/05/22 0900       L-DEX LYMPHEDEMA SCREENING   Measurement Type Unilateral    L-DEX MEASUREMENT EXTREMITY Upper Extremity    POSITION  Standing    DOMINANT SIDE Right    At Risk Side Right    BASELINE SCORE (UNILATERAL) -1.7    L-DEX SCORE (UNILATERAL) 1.9    VALUE CHANGE (UNILAT) 3.6               Otelia Limes, PTA 09/05/2022, 9:09 AM

## 2022-09-07 ENCOUNTER — Ambulatory Visit
Admission: RE | Admit: 2022-09-07 | Discharge: 2022-09-07 | Disposition: A | Discharge status: Home / Self Care | Payer: No Typology Code available for payment source | Source: Ambulatory Visit | Attending: Adult Health | Admitting: Adult Health

## 2022-09-07 DIAGNOSIS — Z170 Estrogen receptor positive status [ER+]: Secondary | ICD-10-CM

## 2022-09-07 DIAGNOSIS — R928 Other abnormal and inconclusive findings on diagnostic imaging of breast: Secondary | ICD-10-CM

## 2022-09-07 DIAGNOSIS — N6489 Other specified disorders of breast: Secondary | ICD-10-CM

## 2022-09-07 HISTORY — PX: BREAST BIOPSY: SHX20

## 2022-10-31 NOTE — Progress Notes (Signed)
Patient Care Team: Fatima Sanger, FNP as PCP - General (Internal Medicine) Griselda Miner, MD as Consulting Physician (General Surgery) Serena Croissant, MD as Consulting Physician (Hematology and Oncology) Lonie Peak, MD as Attending Physician (Radiation Oncology)  DIAGNOSIS: No diagnosis found.  SUMMARY OF ONCOLOGIC HISTORY: Oncology History  Malignant neoplasm of upper-outer quadrant of right breast in female, estrogen receptor positive  09/02/2021 Initial Diagnosis   Palpable right breast lump for 2 to 3 months UOQ.  Mammogram and ultrasound: 10:00: 1.2 cm mass, axilla negative, biopsy: Grade 1 IDC with DCIS (inside a fibroadenoma) ER 90%, PR 100%, HER2 negative, Ki-67 1%   09/08/2021 Cancer Staging   Staging form: Breast, AJCC 8th Edition - Clinical stage from 09/08/2021: Stage IA (cT1c, cN0, cM0, G1, ER+, PR+, HER2-) - Signed by Serena Croissant, MD on 09/08/2021 Stage prefix: Initial diagnosis Histologic grading system: 3 grade system    Genetic Testing   [facility] CancerNext-Expanded is Negative. Report date is 09/17/2021.  The CancerNext-Expanded gene panel offered by [facility] and includes sequencing, rearrangement, and RNA analysis for the following 77 genes: AIP, ALK, APC, ATM, AXIN2, BAP1, BARD1, BLM, BMPR1A, BRCA1, BRCA2, BRIP1, CDC73, CDH1, CDK4, CDKN1B, CDKN2A, CHEK2, CTNNA1, DICER1, FANCC, FH, FLCN, GALNT12, KIF1B, LZTR1, MAX, MEN1, MET, MLH1, MSH2, MSH3, MSH6, MUTYH, NBN, NF1, NF2, NTHL1, PALB2, PHOX2B, PMS2, POT1, PRKAR1A, PTCH1, PTEN, RAD51C, RAD51D, RB1, RECQL, RET, SDHA, SDHAF2, SDHB, SDHC, SDHD, SMAD4, SMARCA4, SMARCB1, SMARCE1, STK11, SUFU, TMEM127, TP53, TSC1, TSC2, VHL and XRCC2 (sequencing and deletion/duplication); EGFR, EGLN1, HOXB13, KIT, MITF, PDGFRA, POLD1, and POLE (sequencing only); EPCAM and GREM1 (deletion/duplication only).    11/10/2021 Surgery   Right lumpectomy: Grade 1 IDC 1.6 cm with intermediate grade DCIS, margins negative, 0/8 lymph nodes  negative, ER 90%, PR 100%, HER2 negative 1+, Ki-67 1%   11/26/2021 Oncotype testing   Oncotype score 15 (ROR 4 %)   12/31/2021 - 01/20/2022 Radiation Therapy   Site Technique Total Dose (Gy) Dose per Fx (Gy) Completed Fx Beam Energies  Breast, Right: Breast_R 3D 42.56/42.56 2.66 16/16 6X     01/18/2022 -  Anti-estrogen oral therapy   Tamoxifen x 10 years     CHIEF COMPLIANT: Follow-up tamoxifen  INTERVAL HISTORY: Kathryn Mckenzie is a  42 year old above-mentioned history of right breast cancer underwent right lumpectomy. She presents to the clinic today for a follow-up.   ALLERGIES:  has No Known Allergies.  MEDICATIONS:  Current Outpatient Medications  Medication Sig Dispense Refill   ALPRAZolam (XANAX) 0.5 MG tablet Take 1 tablet by mouth as needed.     tamoxifen (NOLVADEX) 20 MG tablet Take 1 tablet (20 mg total) by mouth daily. 90 tablet 3   venlafaxine XR (EFFEXOR-XR) 75 MG 24 hr capsule Take 1 capsule (75 mg total) by mouth daily with breakfast. 90 capsule 3   No current facility-administered medications for this visit.    PHYSICAL EXAMINATION: ECOG PERFORMANCE STATUS: {CHL ONC ECOG PS:[phone]}  There were no vitals filed for this visit. There were no vitals filed for this visit.  BREAST:*** No palpable masses or nodules in either right or left breasts. No palpable axillary supraclavicular or infraclavicular adenopathy no breast tenderness or nipple discharge. (exam performed in the presence of a chaperone)  LABORATORY DATA:  I have reviewed the data as listed    Latest Ref Rng & Units 09/08/2021   12:25 PM  CMP  Glucose 70 - 99 mg/dL 99   BUN 6 - 20 mg/dL 15  Creatinine 0.44 - 1.00 mg/dL 1.15   Sodium 726 - 203 mmol/L 139   Potassium 3.5 - 5.1 mmol/L 4.6   Chloride 98 - 111 mmol/L 105   CO2 22 - 32 mmol/L 28   Calcium 8.9 - 10.3 mg/dL 9.6   Total Protein 6.5 - 8.1 g/dL 7.0   Total Bilirubin 0.3 - 1.2 mg/dL 0.4   Alkaline Phos 38 - 126 U/L 39   AST 15 - 41 U/L 18    ALT 0 - 44 U/L 15     Lab Results  Component Value Date   WBC 5.9 09/08/2021   HGB 12.9 09/08/2021   HCT 38.3 09/08/2021   MCV 95.8 09/08/2021   PLT 239 09/08/2021   NEUTROABS 3.4 09/08/2021    ASSESSMENT & PLAN:  No problem-specific Assessment & Plan notes found for this encounter.    No orders of the defined types were placed in this encounter.  The patient has a good understanding of the overall plan. she agrees with it. she will call with any problems that may develop before the next visit here. Total time spent: 30 mins including face to face time and time spent for planning, charting and co-ordination of care   Sherlyn Lick, CMA 10/31/22    I Janan Ridge am acting as a Neurosurgeon for [employer]  ***

## 2022-11-01 ENCOUNTER — Inpatient Hospital Stay
Payer: No Typology Code available for payment source | Attending: Hematology and Oncology | Admitting: Hematology and Oncology

## 2022-11-01 VITALS — BP 144/88 | HR 78 | Temp 97.2°F | Resp 18 | Ht 64.0 in | Wt 128.7 lb

## 2022-11-01 DIAGNOSIS — Z923 Personal history of irradiation: Secondary | ICD-10-CM | POA: Diagnosis not present

## 2022-11-01 DIAGNOSIS — Z170 Estrogen receptor positive status [ER+]: Secondary | ICD-10-CM | POA: Insufficient documentation

## 2022-11-01 DIAGNOSIS — Z79810 Long term (current) use of selective estrogen receptor modulators (SERMs): Secondary | ICD-10-CM | POA: Insufficient documentation

## 2022-11-01 DIAGNOSIS — C50411 Malignant neoplasm of upper-outer quadrant of right female breast: Secondary | ICD-10-CM | POA: Diagnosis present

## 2022-11-01 MED ORDER — TAMOXIFEN CITRATE 20 MG PO TABS: 10.0000 mg | ORAL_TABLET | Freq: Every day | ORAL | 3 refills | Status: DC

## 2022-11-01 NOTE — Assessment & Plan Note (Addendum)
09/02/2021:Palpable right breast lump for 2 to 3 months UOQ.  Mammogram and ultrasound: 10:00: 1.2 cm mass, axilla negative, biopsy: Grade 1 IDC with DCIS (inside a fibroadenoma) ER 90%, PR 100%, HER2 negative, Ki-67 1%   11/10/2021: Right lumpectomy: Grade 1 IDC 1.6 cm with intermediate grade DCIS, margins negative, 0/8 lymph nodes negative, ER 90%, PR 100%, HER2 negative 1+, Ki-67 1%   Oncotype score 15: 4% ROR   Treatment plan: 1. Adjuvant radiation therapy 12/31/21-01/20/22 2. Adjuvant antiestrogen therapy with Tamoxifen 20 mg daily X 10 years started 01/18/2022 reduced to 10 mg on 11/01/2022   Tamoxifen toxicities:  1.  Hot flashes: Improved with Effexor 2. joint stiffness and achiness 3.  Severe constipation: Patient plans to take Colace 4.  Skin and vaginal dryness I discussed with her about reducing the dosage of tamoxifen to 10 mg daily.  Also recommended taking it in the morning.  Breast cancer surveillance: Breast exam 11/01/2022: Benign Mammogram and ultrasound guided biopsy 08/31/2022: Irregular hypoechoic non-mass area at 10:00 measuring 7 mm: Biopsy fibrocystic changes (benign)  Return to clinic in 1 year for follow-up   Return to clinic in 1 year for follow-up

## 2022-12-05 ENCOUNTER — Ambulatory Visit: Payer: No Typology Code available for payment source | Attending: General Surgery

## 2022-12-05 VITALS — Wt 129.1 lb

## 2022-12-05 DIAGNOSIS — Z483 Aftercare following surgery for neoplasm: Secondary | ICD-10-CM

## 2022-12-05 NOTE — Therapy (Signed)
  OUTPATIENT PHYSICAL THERAPY SOZO SCREENING NOTE   Patient Name: Kathryn Mckenzie MRN: [id number] DOB:[birth year], 42 y.o., female Today's Date: 12/05/2022  PCP: Fatima Sanger, FNP REFERRING PROVIDER: Griselda Miner, MD   PT End of Session - 12/05/22 [phone]     Visit Number 11   # unchanged due to screen only   PT Start Time 0920    PT Stop Time 0924    PT Time Calculation (min) 4 min    Activity Tolerance Patient tolerated treatment well    Behavior During Therapy WFL for tasks assessed/performed             Past Medical History:  Diagnosis Date   Anxiety    Breast cancer (HCC)    Personal history of radiation therapy    PONV (postoperative nausea and vomiting)    Past Surgical History:  Procedure Laterality Date   BREAST BIOPSY Bilateral    2023   BREAST BIOPSY Right 09/07/2022   [place] RT BREAST BX W LOC DEV 1ST LESION IMG BX SPEC US GUIDE 09/07/2022 [facility]   BREAST BIOPSY Right 09/07/2022   MM RT BREAST BX W LOC DEV 1ST LESION IMAGE BX SPEC STEREO GUIDE 09/07/2022 [facility]   BREAST LUMPECTOMY Right 2023   BREAST LUMPECTOMY WITH RADIOACTIVE SEED AND SENTINEL LYMPH NODE BIOPSY Right 11/10/2021   Procedure: RIGHT BREAST LUMPECTOMY WITH RADIOACTIVE SEED AND SENTINEL LYMPH NODE BIOPSY;  Surgeon: Griselda Miner, MD;  Location: [facility];  Service: General;  Laterality: Right;   CESAREAN SECTION     TONSILLECTOMY     Patient Active Problem List   Diagnosis Date Noted   Genetic testing 09/20/2021   Family history of breast cancer 09/09/2021   Family history of pancreatic cancer 09/09/2021   Malignant neoplasm of upper-outer quadrant of right breast in female, estrogen receptor positive (HCC) 09/07/2021    REFERRING DIAG: right breast cancer at risk for lymphedema  THERAPY DIAG:  Aftercare following surgery for neoplasm  PERTINENT HISTORY: Patient was diagnosed on 08/30/2021 with right grade I invasive ductal carcinoma breast cancer. She  underwent a right lumpectomy and sentinel node biopsy (8 negative nodes) on 11/10/2021. It is ER/PR positive and HER2 negative with a Ki67 of 1% . She had 4 weeks of radiation which ended on 01/20/2022   PRECAUTIONS: right UE Lymphedema risk, None  SUBJECTIVE: Pt returns for her 3 month L-Dex screen.   PAIN:  Are you having pain? No  SOZO SCREENING: Patient was assessed today using the SOZO machine to determine the lymphedema index score. This was compared to her baseline score. It was determined that she is within the recommended range when compared to her baseline and no further action is needed at this time. She will continue SOZO screenings. These are done every 3 months for 2 years post operatively followed by every 6 months for 2 years, and then annually.   L-DEX FLOWSHEETS - 12/05/22 0900       L-DEX LYMPHEDEMA SCREENING   Measurement Type Unilateral    L-DEX MEASUREMENT EXTREMITY Upper Extremity    POSITION  Standing    DOMINANT SIDE Right    At Risk Side Right    BASELINE SCORE (UNILATERAL) -1.7    L-DEX SCORE (UNILATERAL) -2    VALUE CHANGE (UNILAT) -0.3               Hermenia Bers, PTA 12/05/2022, 9:23 AM

## 2023-01-24 ENCOUNTER — Other Ambulatory Visit: Payer: Self-pay

## 2023-01-24 ENCOUNTER — Other Ambulatory Visit: Payer: Self-pay | Admitting: Hematology and Oncology

## 2023-01-24 MED ORDER — TAMOXIFEN CITRATE 10 MG PO TABS: 10.0000 mg | ORAL_TABLET | Freq: Every day | ORAL | 3 refills | Status: DC

## 2023-01-24 NOTE — Progress Notes (Signed)
Re-ordered tamoxifen rx per MD as 10mg  tablets as Pt is currently taking 10mg  daily and has no remaining 20mg  tablets. Pt previously on 20mg  daily.

## 2023-03-09 ENCOUNTER — Encounter: Payer: Self-pay | Admitting: Hematology and Oncology

## 2023-03-13 ENCOUNTER — Ambulatory Visit: Payer: No Typology Code available for payment source | Attending: General Surgery

## 2023-03-13 VITALS — Wt 130.4 lb

## 2023-03-13 DIAGNOSIS — Z483 Aftercare following surgery for neoplasm: Secondary | ICD-10-CM | POA: Insufficient documentation

## 2023-03-13 NOTE — Therapy (Addendum)
OUTPATIENT PHYSICAL THERAPY SOZO SCREENING NOTE   Patient Name: Kathryn Mckenzie MRN: [id number] DOB:[birth year], 42 y.o., female Today's Date: 03/13/2023  PCP: Fatima Sanger, FNP REFERRING PROVIDER: Griselda Miner, MD   PT End of Session - 03/13/23 1054     Visit Number 11   # unchanged due to screen only   PT Start Time 1052    PT Stop Time 1056    PT Time Calculation (min) 4 min    Activity Tolerance Patient tolerated treatment well    Behavior During Therapy WFL for tasks assessed/performed             Past Medical History:  Diagnosis Date   Anxiety    Breast cancer (HCC)    Personal history of radiation therapy    PONV (postoperative nausea and vomiting)    Past Surgical History:  Procedure Laterality Date   BREAST BIOPSY Bilateral    2023   BREAST BIOPSY Right 09/07/2022   [place] RT BREAST BX W LOC DEV 1ST LESION IMG BX SPEC US GUIDE 09/07/2022 [facility]   BREAST BIOPSY Right 09/07/2022   MM RT BREAST BX W LOC DEV 1ST LESION IMAGE BX SPEC STEREO GUIDE 09/07/2022 [facility]   BREAST LUMPECTOMY Right 2023   BREAST LUMPECTOMY WITH RADIOACTIVE SEED AND SENTINEL LYMPH NODE BIOPSY Right 11/10/2021   Procedure: RIGHT BREAST LUMPECTOMY WITH RADIOACTIVE SEED AND SENTINEL LYMPH NODE BIOPSY;  Surgeon: Griselda Miner, MD;  Location: [facility];  Service: General;  Laterality: Right;   CESAREAN SECTION     TONSILLECTOMY     Patient Active Problem List   Diagnosis Date Noted   Genetic testing 09/20/2021   Family history of breast cancer 09/09/2021   Family history of pancreatic cancer 09/09/2021   Malignant neoplasm of upper-outer quadrant of right breast in female, estrogen receptor positive (HCC) 09/07/2021    REFERRING DIAG: right breast cancer at risk for lymphedema  THERAPY DIAG: Aftercare following surgery for neoplasm  PERTINENT HISTORY: Patient was diagnosed on 08/30/2021 with right grade I invasive ductal carcinoma breast cancer. She  underwent a right lumpectomy and sentinel node biopsy (8 negative nodes) on 11/10/2021. It is ER/PR positive and HER2 negative with a Ki67 of 1% . She had 4 weeks of radiation which ended on 01/20/2022   PRECAUTIONS: right UE Lymphedema risk, None  SUBJECTIVE: Pt returns for her 3 month L-Dex screen. "I feel like the tightness is back around my incision."  PAIN:  Are you having pain? No  SOZO SCREENING: Patient was assessed today using the SOZO machine to determine the lymphedema index score. This was compared to her baseline score. It was determined that she is within the recommended range when compared to her baseline and no further action is needed at this time. She will continue SOZO screenings. These are done every 3 months for 2 years post operatively followed by every 6 months for 2 years, and then annually.  Encouraged pt to cont her stretching and if she doesn't feel like she is making progress at home that she can always return to PT for MFR prn with a new doctor order.    L-DEX FLOWSHEETS - 03/13/23 1000       L-DEX LYMPHEDEMA SCREENING   Measurement Type Unilateral    L-DEX MEASUREMENT EXTREMITY Upper Extremity    POSITION  Standing    DOMINANT SIDE Right    At Risk Side Right    BASELINE SCORE (UNILATERAL) -1.7  L-DEX SCORE (UNILATERAL) -1.2    VALUE CHANGE (UNILAT) 0.5               Hermenia Bers, PTA 03/13/2023, 10:55 AM

## 2023-03-14 ENCOUNTER — Ambulatory Visit
Admission: RE | Admit: 2023-03-14 | Discharge: 2023-03-14 | Disposition: A | Discharge status: Home / Self Care | Payer: No Typology Code available for payment source | Source: Ambulatory Visit | Attending: Hematology and Oncology | Admitting: Hematology and Oncology

## 2023-03-14 DIAGNOSIS — C50411 Malignant neoplasm of upper-outer quadrant of right female breast: Secondary | ICD-10-CM

## 2023-03-14 MED ORDER — GADOPICLENOL 0.5 MMOL/ML IV SOLN
6.0000 mL | Freq: Once | INTRAVENOUS | Status: AC | PRN
  Administered 2023-03-14: 6 mL via INTRAVENOUS

## 2023-03-21 ENCOUNTER — Other Ambulatory Visit: Payer: Self-pay | Admitting: Adult Health

## 2023-03-21 DIAGNOSIS — C50411 Malignant neoplasm of upper-outer quadrant of right female breast: Secondary | ICD-10-CM

## 2023-03-21 NOTE — Telephone Encounter (Signed)
Continue per last appt as this helps with hot flashes. Lorayne Marek, RN

## 2023-03-30 ENCOUNTER — Ambulatory Visit
Admission: RE | Admit: 2023-03-30 | Discharge: 2023-03-30 | Disposition: A | Discharge status: Home / Self Care | Payer: No Typology Code available for payment source | Source: Ambulatory Visit | Attending: Physician Assistant | Admitting: Physician Assistant

## 2023-03-30 VITALS — BP 106/71 | HR 87 | Temp 97.9°F | Resp 16

## 2023-03-30 DIAGNOSIS — W57XXXA Bitten or stung by nonvenomous insect and other nonvenomous arthropods, initial encounter: Secondary | ICD-10-CM

## 2023-03-30 DIAGNOSIS — S30860A Insect bite (nonvenomous) of lower back and pelvis, initial encounter: Secondary | ICD-10-CM | POA: Diagnosis not present

## 2023-03-30 DIAGNOSIS — D709 Neutropenia, unspecified: Secondary | ICD-10-CM | POA: Insufficient documentation

## 2023-03-30 MED ORDER — DEXAMETHASONE SODIUM PHOSPHATE 10 MG/ML IJ SOLN
10.0000 mg | Freq: Once | INTRAMUSCULAR | Status: AC
  Administered 2023-03-30: 10 mg via INTRAMUSCULAR

## 2023-03-30 MED ORDER — DOXYCYCLINE HYCLATE 100 MG PO CAPS: 100.0000 mg | ORAL_CAPSULE | Freq: Two times a day (BID) | ORAL | 0 refills | Status: AC

## 2023-03-30 NOTE — ED Provider Notes (Signed)
[facility]    CSN: [id number] Arrival date & time: 03/30/23  1702      History   Chief Complaint Chief Complaint  Patient presents with   Insect Bite    HPI Kathryn Mckenzie is a 42 y.o. female.   Here today for evaluation of possible bug bites to her right buttock.  She noted areas yesterday that were itchy and then this morning noticed that they were swollen like whelps.  She states that she has had some itching and mild pain throughout the day.  She denies any fever.  She does not report shortness of breath.  She does state that she typically has more significant reactions to insect bites.  She has used topical steroid cream without resolution.  The history is provided by the patient.    Past Medical History:  Diagnosis Date   Anxiety    Breast cancer [facility])    Personal history of radiation therapy    PONV (postoperative nausea and vomiting)     Patient Active Problem List   Diagnosis Date Noted   Neutropenia (HCC) 03/30/2023   Genetic testing 09/20/2021   Family history of breast cancer 09/09/2021   Family history of pancreatic cancer 09/09/2021   Malignant tumor of breast (HCC) 09/09/2021   Anxiety 09/08/2021   Nervousness 09/08/2021   Basal cell carcinoma of skin 09/08/2021   Breast lump 09/08/2021   Hypothyroidism 09/08/2021   Other specified abnormal findings of blood chemistry 09/08/2021   Primary insomnia 09/08/2021   Raynaud's phenomenon 09/08/2021   Malignant neoplasm of upper-outer quadrant of right breast in female, estrogen receptor positive (HCC) 09/07/2021    Past Surgical History:  Procedure Laterality Date   BREAST BIOPSY Bilateral    2023   BREAST BIOPSY Right 09/07/2022   [place] RT BREAST BX W LOC DEV 1ST LESION IMG BX SPEC US GUIDE 09/07/2022 [facility]   BREAST BIOPSY Right 09/07/2022   MM RT BREAST BX W LOC DEV 1ST LESION IMAGE BX SPEC STEREO GUIDE 09/07/2022 [facility]   BREAST LUMPECTOMY Right 2023   BREAST LUMPECTOMY  WITH RADIOACTIVE SEED AND SENTINEL LYMPH NODE BIOPSY Right 11/10/2021   Procedure: RIGHT BREAST LUMPECTOMY WITH RADIOACTIVE SEED AND SENTINEL LYMPH NODE BIOPSY;  Surgeon: Griselda Miner, MD;  Location: [facility];  Service: General;  Laterality: Right;   CESAREAN SECTION     TONSILLECTOMY      OB History   No obstetric history on file.      Home Medications    Prior to Admission medications   Medication Sig Start Date End Date Taking? Authorizing Provider  doxycycline (VIBRAMYCIN) 100 MG capsule Take 1 capsule (100 mg total) by mouth 2 (two) times daily. 03/30/23  Yes Tomi Bamberger, PA-C  tamoxifen (NOLVADEX) 10 MG tablet Take 1 tablet (10 mg total) by mouth daily. 01/24/23   Serena Croissant, MD  venlafaxine XR (EFFEXOR-XR) 75 MG 24 hr capsule TAKE 1 CAPSULE BY MOUTH DAILY WITH BREAKFAST 03/21/23   Serena Croissant, MD    Family History Family History  Problem Relation Age of Onset   Melanoma Maternal Aunt    Melanoma Maternal Uncle    Breast cancer Maternal Grandmother 30   Pancreatic cancer Paternal Grandmother 42    Social History Social History   Tobacco Use   Smoking status: Never   Smokeless tobacco: Never  Vaping Use   Vaping status: Never Used  Substance Use Topics   Alcohol use: Yes  Drug use: Never     Allergies   Patient has no known allergies.   Review of Systems Review of Systems  Constitutional:  Negative for chills and fever.  HENT:  Negative for congestion and rhinorrhea.   Eyes:  Negative for discharge and redness.  Respiratory:  Negative for shortness of breath.   Gastrointestinal:  Negative for vomiting.  Skin:  Positive for color change. Negative for wound.     Physical Exam Triage Vital Signs ED Triage Vitals  Encounter Vitals Group     BP 03/30/23 1706 106/71     Systolic BP Percentile --      Diastolic BP Percentile --      Pulse Rate 03/30/23 1706 87     Resp 03/30/23 1706 16     Temp 03/30/23 1706 97.9 F (36.6 C)      Temp Source 03/30/23 1706 Oral     SpO2 03/30/23 1706 100 %     Weight --      Height --      Head Circumference --      Peak Flow --      Pain Score 03/30/23 1708 4     Pain Loc --      Pain Education --      Exclude from Growth Chart --    No data found.  Updated Vital Signs BP 106/71 (BP Location: Left Arm)   Pulse 87   Temp 97.9 F (36.6 C) (Oral)   Resp 16   LMP  (LMP Unknown)   SpO2 100%       Physical Exam Vitals and nursing note reviewed.  Constitutional:      General: She is not in acute distress.    Appearance: Normal appearance. She is not ill-appearing.  HENT:     Head: Normocephalic and atraumatic.  Eyes:     Conjunctiva/sclera: Conjunctivae normal.  Cardiovascular:     Rate and Rhythm: Normal rate.  Pulmonary:     Effort: Pulmonary effort is normal. No respiratory distress.  Skin:    Comments: Few papular erythematous lesions with surrounding swelling noted to right buttocks  Neurological:     Mental Status: She is alert.  Psychiatric:        Mood and Affect: Mood normal.        Behavior: Behavior normal.        Thought Content: Thought content normal.      UC Treatments / Results  Labs (all labs ordered are listed, but only abnormal results are displayed) Labs Reviewed - No data to display  EKG   Radiology No results found.  Procedures Procedures (including critical care time)  Medications Ordered in UC Medications  dexamethasone (DECADRON) injection 10 mg (has no administration in time range)    Initial Impression / Assessment and Plan / UC Course  I have reviewed the triage vital signs and the nursing notes.  Pertinent labs & imaging results that were available during my care of the patient were reviewed by me and considered in my medical decision making (see chart for details).    Suspect inflammatory reaction to insect bites however given significant erythema and swelling will treat to cover possible secondary infection  as well.  Doxycycline prescribed and steroid injection administered in office.  Recommended follow-up if no gradual improvement with any further concerns.  Final Clinical Impressions(s) / UC Diagnoses   Final diagnoses:  Insect bite, unspecified site, initial encounter   Discharge Instructions   None  ED Prescriptions     Medication Sig Dispense Auth. Provider   doxycycline (VIBRAMYCIN) 100 MG capsule Take 1 capsule (100 mg total) by mouth 2 (two) times daily. 20 capsule Tomi Bamberger, PA-C      PDMP not reviewed this encounter.   Tomi Bamberger, PA-C 03/30/23 1732

## 2023-03-30 NOTE — ED Triage Notes (Signed)
Pt states possible bug  bites to her right  buttocks since yesterday.

## 2023-06-19 ENCOUNTER — Ambulatory Visit: Payer: BC Managed Care – PPO | Attending: General Surgery

## 2023-06-19 DIAGNOSIS — Z483 Aftercare following surgery for neoplasm: Secondary | ICD-10-CM | POA: Insufficient documentation

## 2023-08-02 ENCOUNTER — Other Ambulatory Visit: Payer: Self-pay | Admitting: Hematology and Oncology

## 2023-08-02 DIAGNOSIS — Z9889 Other specified postprocedural states: Secondary | ICD-10-CM

## 2023-08-03 DIAGNOSIS — J101 Influenza due to other identified influenza virus with other respiratory manifestations: Secondary | ICD-10-CM | POA: Diagnosis not present

## 2023-08-03 DIAGNOSIS — R509 Fever, unspecified: Secondary | ICD-10-CM | POA: Diagnosis not present

## 2023-09-04 ENCOUNTER — Ambulatory Visit
Admission: RE | Admit: 2023-09-04 | Discharge: 2023-09-04 | Disposition: A | Discharge status: Home / Self Care | Payer: BC Managed Care – PPO | Source: Ambulatory Visit | Attending: Hematology and Oncology | Admitting: Hematology and Oncology

## 2023-09-04 DIAGNOSIS — Z0000 Encounter for general adult medical examination without abnormal findings: Secondary | ICD-10-CM | POA: Diagnosis not present

## 2023-09-04 DIAGNOSIS — E559 Vitamin D deficiency, unspecified: Secondary | ICD-10-CM | POA: Diagnosis not present

## 2023-09-04 DIAGNOSIS — E039 Hypothyroidism, unspecified: Secondary | ICD-10-CM | POA: Diagnosis not present

## 2023-09-04 DIAGNOSIS — Z853 Personal history of malignant neoplasm of breast: Secondary | ICD-10-CM | POA: Diagnosis not present

## 2023-09-04 DIAGNOSIS — Z9889 Other specified postprocedural states: Secondary | ICD-10-CM

## 2023-09-08 DIAGNOSIS — Z0000 Encounter for general adult medical examination without abnormal findings: Secondary | ICD-10-CM | POA: Diagnosis not present

## 2023-10-13 DIAGNOSIS — Z01419 Encounter for gynecological examination (general) (routine) without abnormal findings: Secondary | ICD-10-CM | POA: Diagnosis not present

## 2023-11-01 NOTE — Assessment & Plan Note (Signed)
 09/02/2021:Palpable right breast lump for 2 to 3 months UOQ.  Mammogram and ultrasound: 10:00: 1.2 cm mass, axilla negative, biopsy: Grade 1 IDC with DCIS (inside a fibroadenoma) ER 90%, PR 100%, HER2 negative, Ki-67 1%   11/10/2021: Right lumpectomy: Grade 1 IDC 1.6 cm with intermediate grade DCIS, margins negative, 0/8 lymph nodes negative, ER 90%, PR 100%, HER2 negative 1+, Ki-67 1%   Oncotype score 15: 4% ROR   Treatment plan: 1. Adjuvant radiation therapy 12/31/21-01/20/22 2. Adjuvant antiestrogen therapy with Tamoxifen 20 mg daily X 10 years started 01/18/2022 reduced to 10 mg on 11/01/2022   Tamoxifen toxicities:  1.  Hot flashes: Improved with Effexor 2. joint stiffness and achiness 3.  Severe constipation: Patient plans to take Colace 4.  Skin and vaginal dryness I discussed with her about reducing the dosage of tamoxifen to 10 mg daily.  Also recommended taking it in the morning.   Breast cancer surveillance: Breast exam 11/02/2023: Benign Mammogram 09/04/23: Benign breast density category D  Return to clinic in 1 year for follow-up

## 2023-11-02 ENCOUNTER — Inpatient Hospital Stay
Payer: No Typology Code available for payment source | Attending: Hematology and Oncology | Admitting: Hematology and Oncology

## 2023-11-02 ENCOUNTER — Other Ambulatory Visit: Payer: Self-pay

## 2023-11-02 VITALS — BP 118/75 | HR 75 | Temp 98.6°F | Resp 18 | Ht 64.0 in | Wt 130.1 lb

## 2023-11-02 DIAGNOSIS — Z79810 Long term (current) use of selective estrogen receptor modulators (SERMs): Secondary | ICD-10-CM | POA: Diagnosis not present

## 2023-11-02 DIAGNOSIS — Z1732 Human epidermal growth factor receptor 2 negative status: Secondary | ICD-10-CM | POA: Diagnosis not present

## 2023-11-02 DIAGNOSIS — Z923 Personal history of irradiation: Secondary | ICD-10-CM | POA: Diagnosis not present

## 2023-11-02 DIAGNOSIS — Z170 Estrogen receptor positive status [ER+]: Secondary | ICD-10-CM | POA: Diagnosis not present

## 2023-11-02 DIAGNOSIS — Z79899 Other long term (current) drug therapy: Secondary | ICD-10-CM | POA: Diagnosis not present

## 2023-11-02 DIAGNOSIS — C50411 Malignant neoplasm of upper-outer quadrant of right female breast: Secondary | ICD-10-CM | POA: Diagnosis not present

## 2023-11-02 DIAGNOSIS — Z1721 Progesterone receptor positive status: Secondary | ICD-10-CM | POA: Diagnosis not present

## 2023-11-02 MED ORDER — VENLAFAXINE HCL ER 37.5 MG PO CP24: 37.5000 mg | ORAL_CAPSULE | Freq: Every day | ORAL | 3 refills | Status: DC

## 2023-11-02 MED ORDER — TAMOXIFEN CITRATE 10 MG PO TABS: 10.0000 mg | ORAL_TABLET | Freq: Every day | ORAL | 3 refills | Status: AC

## 2023-11-02 NOTE — Progress Notes (Signed)
 Patient Care Team: Fatima Sanger, FNP as PCP - General (Internal Medicine) Griselda Miner, MD as Consulting Physician (General Surgery) Serena Croissant, MD as Consulting Physician (Hematology and Oncology) Lonie Peak, MD as Attending Physician (Radiation Oncology)  DIAGNOSIS:  Encounter Diagnosis  Name Primary?   Malignant neoplasm of upper-outer quadrant of right breast in female, estrogen receptor positive (HCC) Yes    SUMMARY OF ONCOLOGIC HISTORY: Oncology History  Malignant neoplasm of upper-outer quadrant of right breast in female, estrogen receptor positive (HCC)  09/02/2021 Initial Diagnosis   Palpable right breast lump for 2 to 3 months UOQ.  Mammogram and ultrasound: 10:00: 1.2 cm mass, axilla negative, biopsy: Grade 1 IDC with DCIS (inside a fibroadenoma) ER 90%, PR 100%, HER2 negative, Ki-67 1%   09/08/2021 Cancer Staging   Staging form: Breast, AJCC 8th Edition - Clinical stage from 09/08/2021: Stage IA (cT1c, cN0, cM0, G1, ER+, PR+, HER2-) - Signed by Serena Croissant, MD on 09/08/2021 Stage prefix: Initial diagnosis Histologic grading system: 3 grade system    Genetic Testing   [facility] CancerNext-Expanded is Negative. Report date is 09/17/2021.  The CancerNext-Expanded gene panel offered by [facility] and includes sequencing, rearrangement, and RNA analysis for the following 77 genes: AIP, ALK, APC, ATM, AXIN2, BAP1, BARD1, BLM, BMPR1A, BRCA1, BRCA2, BRIP1, CDC73, CDH1, CDK4, CDKN1B, CDKN2A, CHEK2, CTNNA1, DICER1, FANCC, FH, FLCN, GALNT12, KIF1B, LZTR1, MAX, MEN1, MET, MLH1, MSH2, MSH3, MSH6, MUTYH, NBN, NF1, NF2, NTHL1, PALB2, PHOX2B, PMS2, POT1, PRKAR1A, PTCH1, PTEN, RAD51C, RAD51D, RB1, RECQL, RET, SDHA, SDHAF2, SDHB, SDHC, SDHD, SMAD4, SMARCA4, SMARCB1, SMARCE1, STK11, SUFU, TMEM127, TP53, TSC1, TSC2, VHL and XRCC2 (sequencing and deletion/duplication); EGFR, EGLN1, HOXB13, KIT, MITF, PDGFRA, POLD1, and POLE (sequencing only); EPCAM and GREM1 (deletion/duplication  only).    11/10/2021 Surgery   Right lumpectomy: Grade 1 IDC 1.6 cm with intermediate grade DCIS, margins negative, 0/8 lymph nodes negative, ER 90%, PR 100%, HER2 negative 1+, Ki-67 1%   11/26/2021 Oncotype testing   Oncotype score 15 (ROR 4 %)   12/31/2021 - 01/20/2022 Radiation Therapy   Site Technique Total Dose (Gy) Dose per Fx (Gy) Completed Fx Beam Energies  Breast, Right: Breast_R 3D 42.56/42.56 2.66 16/16 6X     01/18/2022 -  Anti-estrogen oral therapy   Tamoxifen x 10 years     CHIEF COMPLIANT: Follow-up of breast cancer on tamoxifen  HISTORY OF PRESENT ILLNESS:   History of Present Illness The patient, a two-year breast cancer survivor, presents with concerns about side effects from tamoxifen and Effexor, which were prescribed following her diagnosis. She reports experiencing hot flashes and joint stiffness, which she believes could be due to the medication or natural aging. She also reports a weight gain of twelve pounds since her diagnosis, which she has never experienced before. She is unsure if this weight gain is due to the medication, natural aging, or a combination of both. The patient expresses a desire to reduce her medication intake, specifically Effexor, to which the doctor agrees. The patient also expresses anxiety about the recurrence of cancer due to scar tissue and difficulty in self-examination.     ALLERGIES:  has no known allergies.  MEDICATIONS:  Current Outpatient Medications  Medication Sig Dispense Refill   doxycycline (VIBRAMYCIN) 100 MG capsule Take 1 capsule (100 mg total) by mouth 2 (two) times daily. 20 capsule 0   tamoxifen (NOLVADEX) 10 MG tablet Take 1 tablet (10 mg total) by mouth daily. 90 tablet 3   venlafaxine XR (EFFEXOR-XR) 37.5  MG 24 hr capsule Take 1 capsule (37.5 mg total) by mouth daily with breakfast. 30 capsule 3   No current facility-administered medications for this visit.    PHYSICAL EXAMINATION: ECOG PERFORMANCE STATUS: 1 -  Symptomatic but completely ambulatory  Vitals:   11/02/23 0938  BP: 118/75  Pulse: 75  Resp: 18  Temp: 98.6 F (37 C)  SpO2: 100%   Filed Weights   11/02/23 0938  Weight: 130 lb 1.6 oz (59 kg)    Physical Exam No palpable lumps or nodules in bilateral breasts or axilla  (exam performed in the presence of a chaperone)  LABORATORY DATA:  I have reviewed the data as listed    Latest Ref Rng & Units 09/08/2021   12:25 PM  CMP  Glucose 70 - 99 mg/dL 99   BUN 6 - 20 mg/dL 15   Creatinine 1.61 - 1.00 mg/dL 0.96   Sodium 045 - 409 mmol/L 139   Potassium 3.5 - 5.1 mmol/L 4.6   Chloride 98 - 111 mmol/L 105   CO2 22 - 32 mmol/L 28   Calcium 8.9 - 10.3 mg/dL 9.6   Total Protein 6.5 - 8.1 g/dL 7.0   Total Bilirubin 0.3 - 1.2 mg/dL 0.4   Alkaline Phos 38 - 126 U/L 39   AST 15 - 41 U/L 18   ALT 0 - 44 U/L 15     Lab Results  Component Value Date   WBC 5.9 09/08/2021   HGB 12.9 09/08/2021   HCT 38.3 09/08/2021   MCV 95.8 09/08/2021   PLT 239 09/08/2021   NEUTROABS 3.4 09/08/2021    ASSESSMENT & PLAN:  Malignant neoplasm of upper-outer quadrant of right breast in female, estrogen receptor positive (HCC) 09/02/2021:Palpable right breast lump for 2 to 3 months UOQ.  Mammogram and ultrasound: 10:00: 1.2 cm mass, axilla negative, biopsy: Grade 1 IDC with DCIS (inside a fibroadenoma) ER 90%, PR 100%, HER2 negative, Ki-67 1%   11/10/2021: Right lumpectomy: Grade 1 IDC 1.6 cm with intermediate grade DCIS, margins negative, 0/8 lymph nodes negative, ER 90%, PR 100%, HER2 negative 1+, Ki-67 1%   Oncotype score 15: 4% ROR   Treatment plan: 1. Adjuvant radiation therapy 12/31/21-01/20/22 2. Adjuvant antiestrogen therapy with Tamoxifen 20 mg daily X 10 years started 01/18/2022 reduced to 10 mg on 11/01/2022   Tamoxifen toxicities:  1.  Hot flashes: Improved with Effexor: Since hot flashes improved significantly, we will reduce the dosage of Effexor to 37.5 mg and she will slowly taper it and  discontinue it. 2. joint stiffness and achiness: Much improved   Breast cancer surveillance: Breast exam 11/02/2023: Benign Mammogram 09/04/23: Benign breast density category D MRD monitoring with guardant reveal  Return to clinic in 1 year for follow-up       Orders Placed This Encounter  Procedures   MR BREAST BILATERAL W WO CONTRAST INC CAD    Standing Status:   Future    Expected Date:   03/14/2024    Expiration Date:   11/01/2024    If indicated for the ordered procedure, I authorize the administration of contrast media per Radiology protocol:   Yes    What is the patient's sedation requirement?:   No Sedation    Does the patient have a pacemaker or implanted devices?:   No    Preferred imaging location?:   [facility] (table limit-550lbs)    Release to patient:   Immediate   The patient has a good  understanding of the overall plan. she agrees with it. she will call with any problems that may develop before the next visit here. Total time spent: 30 mins including face to face time and time spent for planning, charting and co-ordination of care   Tamsen Meek, MD 11/02/23

## 2023-11-03 ENCOUNTER — Telehealth: Payer: Self-pay

## 2023-11-03 NOTE — Telephone Encounter (Signed)
 Per md orders entered for Guardant Reveal and all supported documents faxed to [phone]. Faxed confirmation was received.

## 2023-12-07 ENCOUNTER — Telehealth: Payer: Self-pay | Admitting: *Deleted

## 2023-12-07 DIAGNOSIS — Z170 Estrogen receptor positive status [ER+]: Secondary | ICD-10-CM | POA: Diagnosis not present

## 2023-12-07 DIAGNOSIS — C50411 Malignant neoplasm of upper-outer quadrant of right female breast: Secondary | ICD-10-CM | POA: Diagnosis not present

## 2023-12-07 NOTE — Telephone Encounter (Signed)
 Per MD request RN placed call to pt with recent Guardant Reveal results being negative.  Pt educated and verbalized understanding.

## 2023-12-12 ENCOUNTER — Encounter: Payer: Self-pay | Admitting: Hematology and Oncology

## 2024-01-17 DIAGNOSIS — D2262 Melanocytic nevi of left upper limb, including shoulder: Secondary | ICD-10-CM | POA: Diagnosis not present

## 2024-01-17 DIAGNOSIS — L821 Other seborrheic keratosis: Secondary | ICD-10-CM | POA: Diagnosis not present

## 2024-01-17 DIAGNOSIS — D224 Melanocytic nevi of scalp and neck: Secondary | ICD-10-CM | POA: Diagnosis not present

## 2024-01-17 DIAGNOSIS — L7211 Pilar cyst: Secondary | ICD-10-CM | POA: Diagnosis not present

## 2024-02-01 ENCOUNTER — Other Ambulatory Visit: Payer: Self-pay | Admitting: Hematology and Oncology

## 2024-02-01 DIAGNOSIS — Z170 Estrogen receptor positive status [ER+]: Secondary | ICD-10-CM

## 2024-02-16 ENCOUNTER — Encounter: Payer: Self-pay | Admitting: Hematology and Oncology

## 2024-03-05 DIAGNOSIS — H5213 Myopia, bilateral: Secondary | ICD-10-CM | POA: Diagnosis not present

## 2024-03-05 DIAGNOSIS — H04123 Dry eye syndrome of bilateral lacrimal glands: Secondary | ICD-10-CM | POA: Diagnosis not present

## 2024-03-14 ENCOUNTER — Ambulatory Visit
Admission: RE | Admit: 2024-03-14 | Discharge: 2024-03-14 | Disposition: A | Discharge status: Home / Self Care | Source: Ambulatory Visit | Attending: Hematology and Oncology | Admitting: Hematology and Oncology

## 2024-03-14 DIAGNOSIS — Z170 Estrogen receptor positive status [ER+]: Secondary | ICD-10-CM

## 2024-03-14 DIAGNOSIS — Z853 Personal history of malignant neoplasm of breast: Secondary | ICD-10-CM | POA: Diagnosis not present

## 2024-03-14 DIAGNOSIS — Z1239 Encounter for other screening for malignant neoplasm of breast: Secondary | ICD-10-CM | POA: Diagnosis not present

## 2024-03-14 MED ORDER — GADOPICLENOL 0.5 MMOL/ML IV SOLN
6.0000 mL | Freq: Once | INTRAVENOUS | Status: AC | PRN
  Administered 2024-03-14: 6 mL via INTRAVENOUS

## 2024-03-18 ENCOUNTER — Ambulatory Visit: Payer: Self-pay

## 2024-03-18 NOTE — Telephone Encounter (Signed)
-----   Message from Morna JAYSON Kendall sent at 03/16/2024 12:14 AM EDT ----- Pleas eclal patient and let her know MRI was negative ----- Message ----- From: Interface, Rad Results In Sent: 03/14/2024   1:23 PM EDT To: Mackey Chad, MD

## 2024-03-18 NOTE — Telephone Encounter (Signed)
 Called patient back as Morna requested to let her know her MRI was negative. Left voice message on confirmed line. Andrea CHRISTELLA Plunk, RN

## 2024-04-14 IMAGING — MG MM PLC BREAST LOC DEV 1ST LESION INC*R*
8 of 9 series · 8 of 9 positions shown · non-contrast
Comparison: Previous exam(s).

CLINICAL DATA: Patient presents for radioactive seed localization
of a right carcinoma prior to surgical excision. She also has an
adjacent lesion, which was not biopsied, but was localized with a
coil clip for excision at the time of excision of the biopsy-proven
carcinoma.

EXAM:
MAMMOGRAPHIC GUIDED RADIOACTIVE SEED LOCALIZATION OF THE RIGHT
BREAST: 2 SEEDS PLACED

[R LM (1 of 2)]
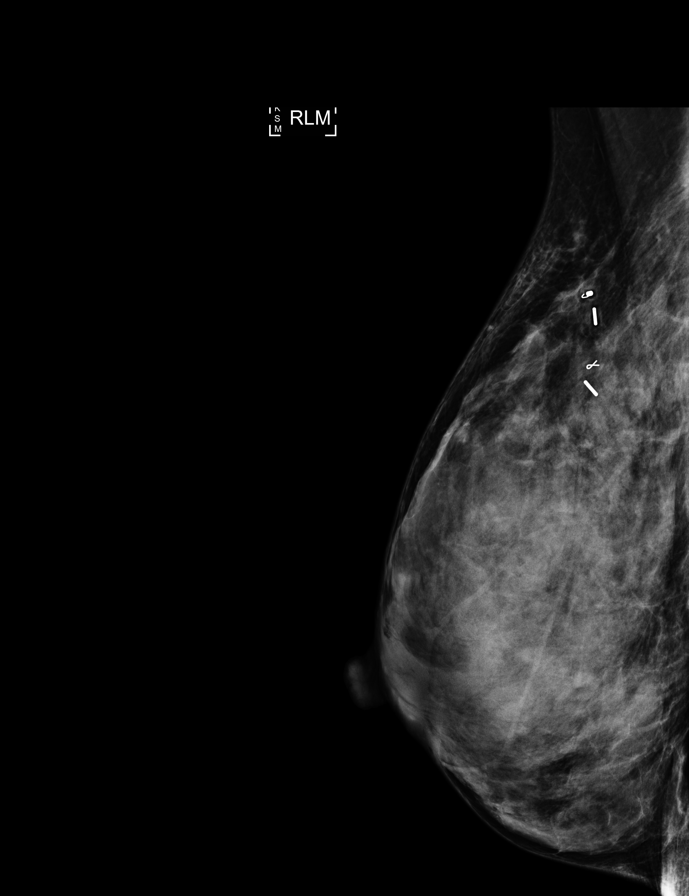

[R CC (1 of 6)]
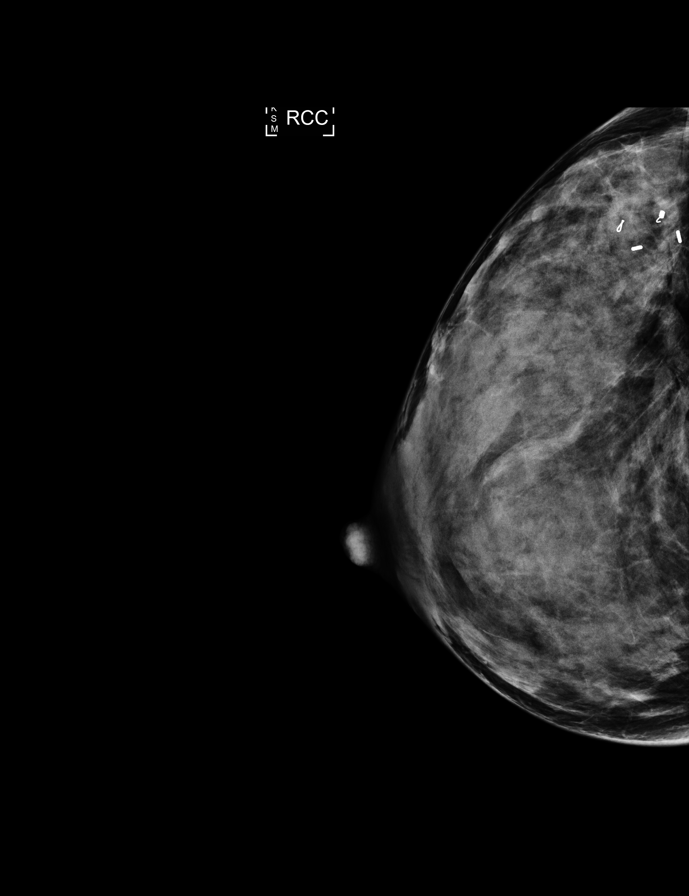

[R LM (2 of 2)]
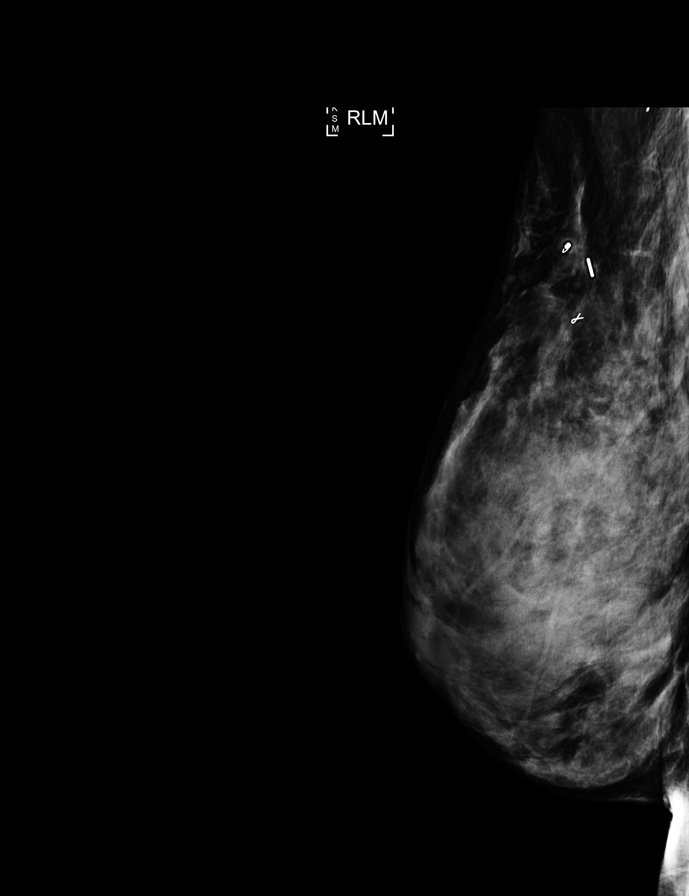

[R CC (2 of 6)]
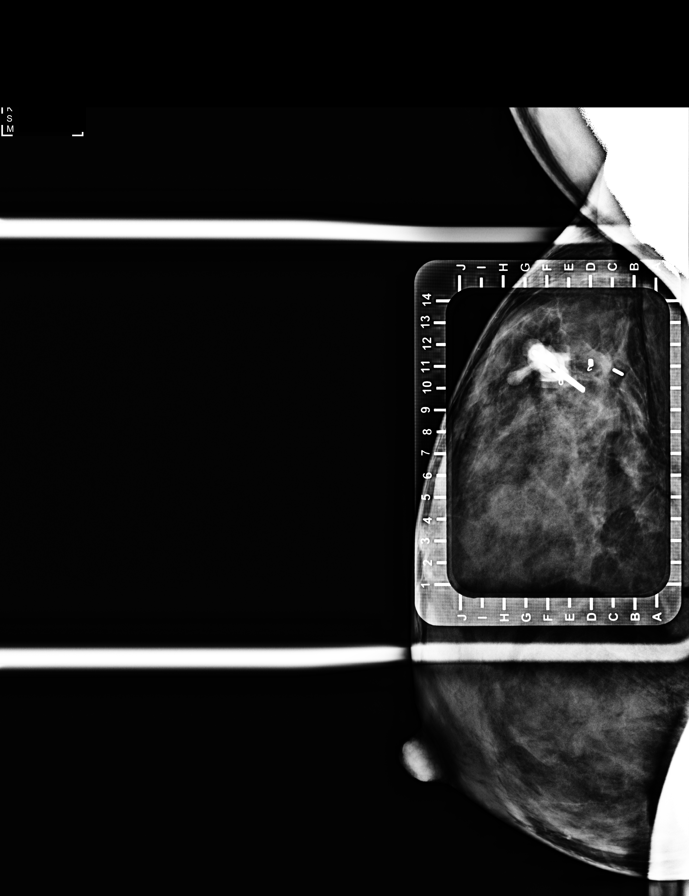

[R CC (3 of 6)]
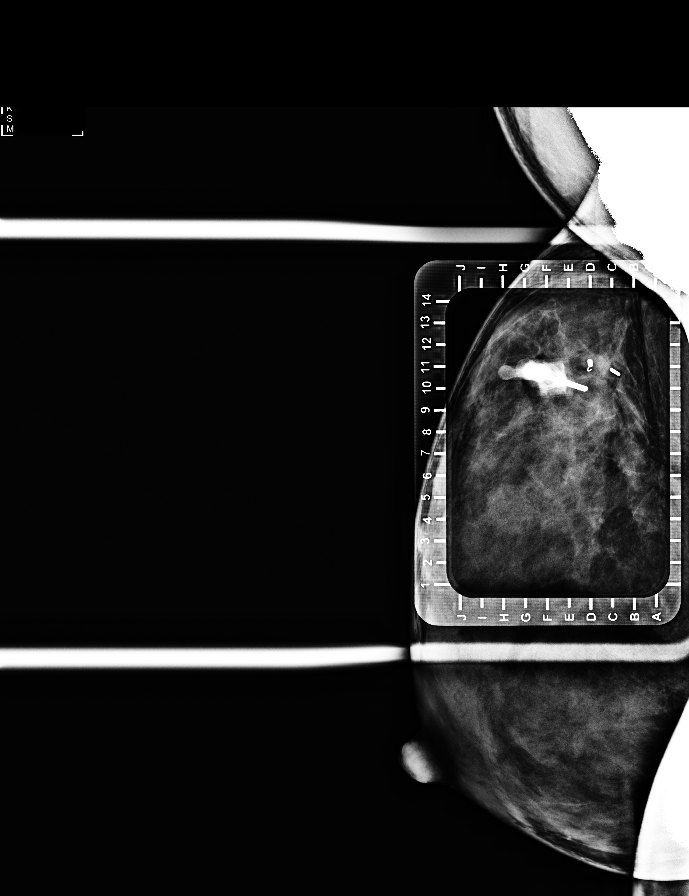

[R CC (4 of 6)]
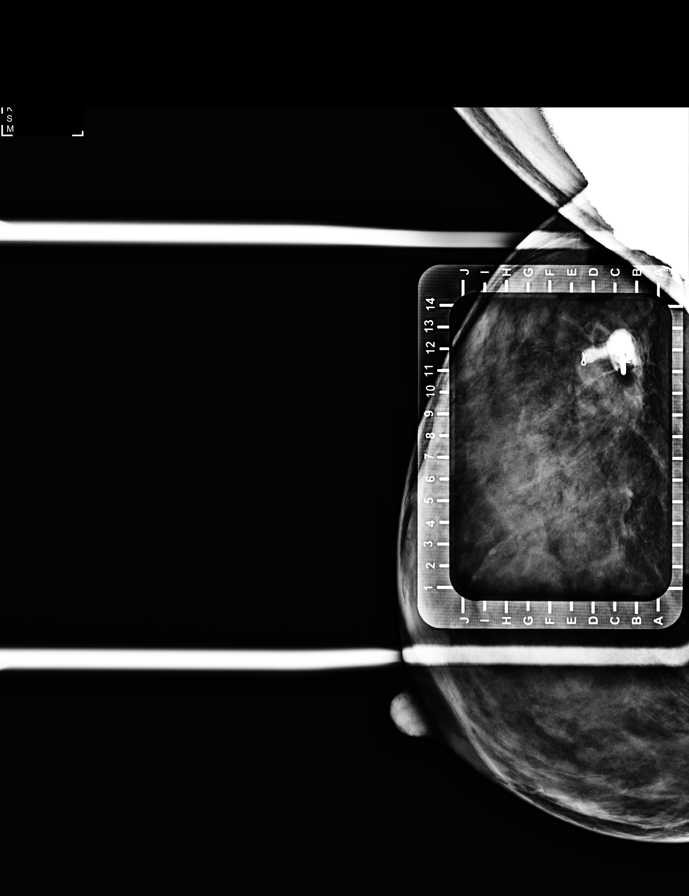

[R CC (5 of 6)]
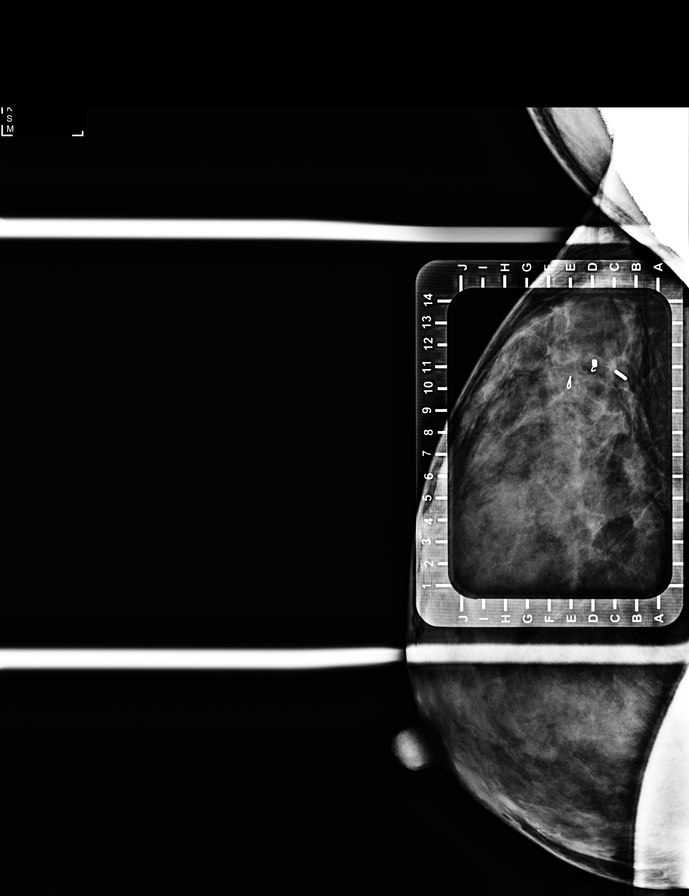

[R CC (6 of 6)]
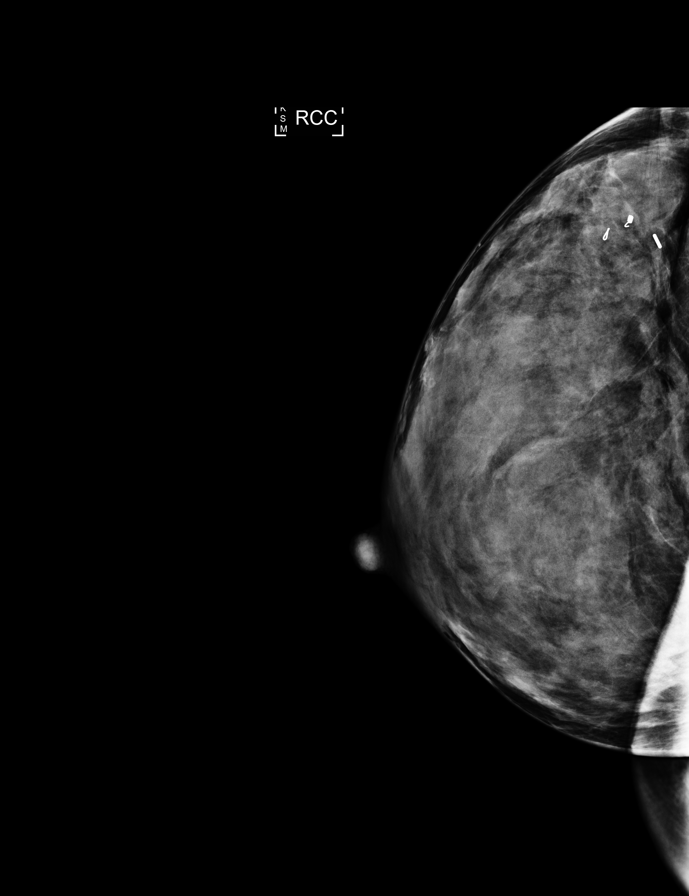

[8 of 9 positions shown; findings below may reference images not displayed]

FINDINGS: Patient presents for radioactive seed localization prior to surgical
excision. I met with the patient and we discussed the procedure of
seed localization including benefits and alternatives. We discussed
the high likelihood of a successful procedure. We discussed the
risks of the procedure including infection, bleeding, tissue injury
and further surgery. We discussed the low dose of radioactivity
involved in the procedure. Informed, written consent was given.

The usual time-out protocol was performed immediately prior to the
procedure.

Using mammographic guidance, sterile technique, 1% lidocaine and an
3-CFJ radioactive seed, the ribbon shaped biopsy clip was localized
using a superior approach. The follow-up mammogram images confirm
the seed in the expected location and were marked for Dr. Santiesteban.

Follow-up survey of the patient confirms presence of the radioactive
seed.

Order number of 3-CFJ seed:  [id number].

Total activity:  0.251 millicuries reference Date: 10/13/2021

Using mammographic guidance, sterile technique, 1% lidocaine and an
3-CFJ radioactive seed, the coil shaped biopsy clip was localized
using a superior approach. The follow-up mammogram images confirm
the seed in the expected location and were marked for Dr. Santiesteban.

Follow-up survey of the patient confirms presence of the radioactive
seed.

Order number of 3-CFJ seed:  [id number].

Total activity:  0.251 millicuries reference Date: 10/13/2021

The patient tolerated the procedure well and was released from the
[REDACTED]. She was given instructions regarding seed removal.
IMPRESSION: Radioactive seed localization of the right breast. Two seeds placed
to localize the ribbon clip, the biopsy proven malignancy, and the
adjacent coil clip, the un-biopsied suspicious mass. No apparent
complications.

## 2024-05-28 ENCOUNTER — Other Ambulatory Visit: Payer: Self-pay | Admitting: *Deleted

## 2024-05-28 DIAGNOSIS — Z8262 Family history of osteoporosis: Secondary | ICD-10-CM

## 2024-05-28 DIAGNOSIS — Z170 Estrogen receptor positive status [ER+]: Secondary | ICD-10-CM

## 2024-05-28 NOTE — Progress Notes (Signed)
 Received call from pt requesting orders placed for bone density.  Pt states she is concerned due to a high family hx of osteoporosis as well as being on Tamoxifen .  RN reviewed with MD and orders placed. Pt educated to contact scheduling to schedule, pt verbalized understanding.

## 2024-05-30 ENCOUNTER — Encounter: Payer: Self-pay | Admitting: *Deleted

## 2024-05-30 NOTE — Progress Notes (Signed)
 Guardant Reveal renewal orders placed via the portal.

## 2024-06-11 DIAGNOSIS — Z853 Personal history of malignant neoplasm of breast: Secondary | ICD-10-CM | POA: Diagnosis not present

## 2024-06-11 DIAGNOSIS — Z1211 Encounter for screening for malignant neoplasm of colon: Secondary | ICD-10-CM | POA: Diagnosis not present

## 2024-06-14 ENCOUNTER — Other Ambulatory Visit: Payer: Self-pay | Admitting: Hematology and Oncology

## 2024-06-14 DIAGNOSIS — Z170 Estrogen receptor positive status [ER+]: Secondary | ICD-10-CM

## 2024-06-14 NOTE — Telephone Encounter (Signed)
 Received refill request from pharmacy for effexor , reached out to patient twice regarding whether or not it's still needed. Per MD last note, pt to taper off effexor . RN attempt x2 to contact pt. Left vm.

## 2024-06-17 ENCOUNTER — Telehealth: Payer: Self-pay

## 2024-06-17 ENCOUNTER — Encounter: Payer: Self-pay | Admitting: Hematology and Oncology

## 2024-06-17 NOTE — Telephone Encounter (Signed)
 Called pt per MD to advise Guardant testing was negative/not detected. Lvm for patient to return call if any questions or concerns.

## 2024-06-28 ENCOUNTER — Other Ambulatory Visit: Payer: Self-pay | Admitting: Hematology and Oncology

## 2024-06-28 DIAGNOSIS — Z170 Estrogen receptor positive status [ER+]: Secondary | ICD-10-CM

## 2024-07-15 ENCOUNTER — Other Ambulatory Visit (HOSPITAL_BASED_OUTPATIENT_CLINIC_OR_DEPARTMENT_OTHER)

## 2024-08-20 ENCOUNTER — Other Ambulatory Visit (HOSPITAL_BASED_OUTPATIENT_CLINIC_OR_DEPARTMENT_OTHER)

## 2024-08-20 ENCOUNTER — Inpatient Hospital Stay (HOSPITAL_BASED_OUTPATIENT_CLINIC_OR_DEPARTMENT_OTHER): Admission: RE | Admit: 2024-08-20 | Source: Ambulatory Visit

## 2024-08-21 ENCOUNTER — Other Ambulatory Visit: Payer: Self-pay | Admitting: Hematology and Oncology

## 2024-08-21 DIAGNOSIS — Z1231 Encounter for screening mammogram for malignant neoplasm of breast: Secondary | ICD-10-CM

## 2024-09-06 ENCOUNTER — Ambulatory Visit

## 2024-09-17 ENCOUNTER — Other Ambulatory Visit (HOSPITAL_BASED_OUTPATIENT_CLINIC_OR_DEPARTMENT_OTHER)

## 2024-11-04 ENCOUNTER — Ambulatory Visit: Admitting: Hematology and Oncology
